# Patient Record
Sex: Female | Born: 1990
Health system: Southern US, Community
[De-identification: ages and names within clinical notes are randomized; demographics above are authoritative.]

## PROBLEM LIST (undated history)

## (undated) DIAGNOSIS — K5790 Diverticulosis of intestine, part unspecified, without perforation or abscess without bleeding: Secondary | ICD-10-CM

## (undated) DIAGNOSIS — K5792 Diverticulitis of intestine, part unspecified, without perforation or abscess without bleeding: Secondary | ICD-10-CM

## (undated) DIAGNOSIS — T7840XA Allergy, unspecified, initial encounter: Secondary | ICD-10-CM

## (undated) DIAGNOSIS — K7689 Other specified diseases of liver: Secondary | ICD-10-CM

## (undated) HISTORY — DX: Other specified diseases of liver: K76.89

## (undated) HISTORY — PX: NO PAST SURGERIES: SHX2092

## (undated) HISTORY — DX: Allergy, unspecified, initial encounter: T78.40XA

## (undated) HISTORY — DX: Diverticulitis of intestine, part unspecified, without perforation or abscess without bleeding: K57.92

## (undated) HISTORY — DX: Diverticulosis of intestine, part unspecified, without perforation or abscess without bleeding: K57.90

## (undated) HISTORY — PX: OTHER SURGICAL HISTORY: SHX169

---
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
## 2009-11-08 ENCOUNTER — Telehealth: Payer: Self-pay | Admitting: Family Medicine

## 2009-11-08 ENCOUNTER — Ambulatory Visit: Payer: Self-pay | Admitting: Family Medicine

## 2009-11-08 LAB — CONVERTED CEMR LAB
ALT: 11 units/L (ref 0–35)
AST: 17 units/L (ref 0–37)
Albumin: 4.6 g/dL (ref 3.5–5.2)
BUN: 11 mg/dL (ref 6–23)
Basophils Relative: 0.6 % (ref 0.0–3.0)
Chloride: 104 meq/L (ref 96–112)
Eosinophils Relative: 2.6 % (ref 0.0–5.0)
Glucose, Urine, Semiquant: NEGATIVE
HCT: 41.4 % (ref 36.0–46.0)
Hemoglobin: 14.3 g/dL (ref 12.0–15.0)
Lymphs Abs: 1.9 10*3/uL (ref 0.7–4.0)
MCV: 95.5 fL (ref 78.0–100.0)
Monocytes Absolute: 0.4 10*3/uL (ref 0.1–1.0)
Neutro Abs: 6.3 10*3/uL (ref 1.4–7.7)
Nitrite: NEGATIVE
Platelets: 303 10*3/uL (ref 150.0–400.0)
Potassium: 4.5 meq/L (ref 3.5–5.1)
Protein, U semiquant: NEGATIVE
Sodium: 142 meq/L (ref 135–145)
Total Bilirubin: 0.7 mg/dL (ref 0.3–1.2)
Total Protein: 7.5 g/dL (ref 6.0–8.3)
WBC Urine, dipstick: NEGATIVE
WBC: 8.8 10*3/uL (ref 4.5–10.5)
pH: 5

## 2009-11-10 ENCOUNTER — Encounter: Payer: Self-pay | Admitting: Family Medicine

## 2009-11-11 ENCOUNTER — Emergency Department (HOSPITAL_COMMUNITY): Admission: EM | Admit: 2009-11-11 | Discharge: 2009-11-11 | Payer: Self-pay | Admitting: Emergency Medicine

## 2009-11-13 ENCOUNTER — Encounter: Payer: Self-pay | Admitting: Family Medicine

## 2010-04-24 NOTE — Consult Note (Signed)
Summary: [facility]  [facility]   Imported By: Lanelle Bal 11/21/2009 09:27:01  _____________________________________________________________________  External Attachment:    Type:   Image     Comment:   External Document

## 2010-04-24 NOTE — Assessment & Plan Note (Signed)
Summary: NEW,RECTAL BLEEDING x3DAYS,[facility] INS/RH.....   Vital Signs:  Patient profile:   20 year old female Height:      67.5 inches Weight:      140 pounds BMI:     21.68 Temp:     98.2 degrees F oral Pulse rate:   66 / minute BP sitting:   110 / 76  (left arm)  Vitals Entered By: Jeremy Johann CMA (November 08, 2009 8:59 AM) CC: NEW TO ESTABLISH RECTAL BLEED WITH BM X3DAYS   History of Present Illness: Pt here to establish ---she c/o rectal bleeding ---she noticed blood on TP and in toilet.  She denies straining to go to BR or constipation.  She thinks she may have had a hemrrhoid.  She felt like she had chills and fever yesterday but she did not take her temp.   No heartburn or abd pain.    Preventive Screening-Counseling & Management  Alcohol-Tobacco     Alcohol drinks/day: <1     Smoking Status: current     Smoking Cessation Counseling: yes     Smoke Cessation Stage: precontemplative     Packs/Day: 0.5     Year Started: 2009  Caffeine-Diet-Exercise     Caffeine use/day: 2     Does Patient Exercise: yes      Drug Use:  no.    Current Medications (verified): 1)  None  Allergies (verified): No Known Drug Allergies  Past History:  Past medical, surgical, family and social histories (including risk factors) reviewed for relevance to current acute and chronic problems.  Past Medical History: NONE  Family History: Reviewed history and no changes required. HTN-DAD BREAST CA-MATERNAL GM  Social History: Reviewed history and no changes required. Single Current Smoker Alcohol use-yes Drug use-no Regular exercise-yes Smoking Status:  current Drug Use:  no Does Patient Exercise:  yes Packs/Day:  0.5 Caffeine use/day:  2  Review of Systems      See HPI  Physical Exam  General:  Well-developed,well-nourished,in no acute distress; alert,appropriate and cooperative throughout examination Abdomen:  Bowel sounds positive,abdomen soft and non-tender  without masses, organomegaly or hernias noted. Rectal:  no external abnormalities.   heme negative secretions--no stool in vaults + round smooth mass palpated ant Genitalia:  Pt on period and small tampon in place Psych:  Cognition and judgment appear intact. Alert and cooperative with normal attention span and concentration. No apparent delusions, illusions, hallucinations   Impression & Recommendations:  Problem # 1:  RECTAL MASS (ICD-787.99)  Orders: Venipuncture (95638) TLB-BMP (Basic Metabolic Panel-BMET) (80048-METABOL) TLB-CBC Platelet - w/Differential (85025-CBCD) TLB-Hepatic/Liver Function Pnl (80076-HEPATIC) Specimen Handling (75643) Gastroenterology Referral (GI)  Problem # 2:  BLOOD IN STOOL (ICD-578.1) per pt hx  Orders: Venipuncture (32951) TLB-BMP (Basic Metabolic Panel-BMET) (80048-METABOL) TLB-CBC Platelet - w/Differential (85025-CBCD) TLB-Hepatic/Liver Function Pnl (80076-HEPATIC) Specimen Handling (88416) Gastroenterology Referral (GI)  Laboratory Results   Urine Tests   Date/Time Reported: November 08, 2009 9:37 AM  Routine Urinalysis   Color: yellow Appearance: Clear Glucose: negative   (Normal Range: Negative) Bilirubin: negative   (Normal Range: Negative) Ketone: negative   (Normal Range: Negative) Spec. Gravity: 1.020   (Normal Range: 1.003-1.035) Blood: moderate   (Normal Range: Negative) pH: 5.0   (Normal Range: 5.0-8.0) Protein: negative   (Normal Range: Negative) Urobilinogen: 0.2   (Normal Range: 0-1) Nitrite: negative   (Normal Range: Negative) Leukocyte Esterace: negative   (Normal Range: Negative)    Urine HCG: negative

## 2010-04-24 NOTE — Progress Notes (Signed)
Summary: Pt upset after with OV--mother returned call  Phone Note Call from Patient   Caller: Mom Details for Reason: Questions about Appt Summary of Call: Rcv'd mssg from Mother Aileena Iglesia who wanted to discuss appt due to pt being unhappy when she arrived home. Mother would like a return call from Dr. Laury Axon C/B number [phone]. Initial call taken by: Almeta Monas CMA Duncan Dull),  November 08, 2009 3:50 PM  Follow-up for Phone Call        I will await for DPR to be scanned before I call patient mother to confirm that we protect patient privacy. Lucious Groves CMA  November 08, 2009 4:00 PM   Additional Follow-up for Phone Call Additional follow up Details #1::        DPR appoints mom, Passion Lavin to recieve information....left messge for mom to call back.Harold Barban  November 09, 2009 9:09 AM    Additional Follow-up for Phone Call Additional follow up Details #2::    Spoke with patient's mom and it is not that she is unhappy with the visit or Dr. Laury Axon, she was just upset about the rectal mass found and was scared some and was unable t tell her mom all the information she wanted to. She asked that I speak to Dr. Laury Axon to see what further information I could give her before th GI appt tomorrow. She was wanting to know if it more of a hemrroid or something truly to be concerned about like a tumor.  Follow-up by: Harold Barban,  November 09, 2009 10:41 AM  Additional Follow-up for Phone Call Additional follow up Details #3:: Details for Additional Follow-up Action Taken: called and left message on vm. yrlowne 11am  11/09/2009  MS Derrell Lolling called back--will call Dr Laury Axon after 1:15 today//sph  d/w situation with pts mom--- explained that she had a golf ball size mass in rectum that was hard---she is seeing GI tomorrow for further evaluation.  Mom will go with her.   yrlowne 11/09/2009 130p

## 2010-04-24 NOTE — Procedures (Signed)
Summary: [facility]  [facility]   Imported By: Lanelle Bal 11/28/2009 11:46:01  _____________________________________________________________________  External Attachment:    Type:   Image     Comment:   External Document

## 2010-06-08 LAB — POCT PREGNANCY, URINE: Preg Test, Ur: NEGATIVE

## 2010-06-08 LAB — URINALYSIS, ROUTINE W REFLEX MICROSCOPIC
Bilirubin Urine: NEGATIVE
Ketones, ur: NEGATIVE mg/dL
Leukocytes, UA: NEGATIVE
Nitrite: NEGATIVE
Protein, ur: NEGATIVE mg/dL
Urobilinogen, UA: 1 mg/dL (ref 0.0–1.0)

## 2010-06-08 LAB — BASIC METABOLIC PANEL
BUN: 12 mg/dL (ref 6–23)
Chloride: 105 mEq/L (ref 96–112)
Glucose, Bld: 93 mg/dL (ref 70–99)
Potassium: 3.9 mEq/L (ref 3.5–5.1)
Sodium: 139 mEq/L (ref 135–145)

## 2010-06-08 LAB — CBC
MCV: 94.3 fL (ref 78.0–100.0)
Platelets: 313 10*3/uL (ref 150–400)
RBC: 4.38 MIL/uL (ref 3.87–5.11)
RDW: 13.4 % (ref 11.5–15.5)
WBC: 10.9 10*3/uL — ABNORMAL HIGH (ref 4.0–10.5)

## 2010-06-08 LAB — DIFFERENTIAL
Basophils Absolute: 0 10*3/uL (ref 0.0–0.1)
Eosinophils Relative: 3 % (ref 0–5)
Lymphocytes Relative: 22 % (ref 12–46)
Lymphs Abs: 2.4 10*3/uL (ref 0.7–4.0)
Neutrophils Relative %: 69 % (ref 43–77)

## 2010-09-03 ENCOUNTER — Inpatient Hospital Stay (HOSPITAL_COMMUNITY)
Admission: AD | Admit: 2010-09-03 | Discharge: 2010-09-04 | Disposition: A | Discharge status: Home / Self Care | Payer: Self-pay | Source: Ambulatory Visit | Attending: Obstetrics & Gynecology | Admitting: Obstetrics & Gynecology

## 2017-03-26 DIAGNOSIS — Z01419 Encounter for gynecological examination (general) (routine) without abnormal findings: Secondary | ICD-10-CM | POA: Diagnosis not present

## 2017-03-26 DIAGNOSIS — Z124 Encounter for screening for malignant neoplasm of cervix: Secondary | ICD-10-CM | POA: Diagnosis not present

## 2017-03-26 DIAGNOSIS — Z6821 Body mass index (BMI) 21.0-21.9, adult: Secondary | ICD-10-CM | POA: Diagnosis not present

## 2017-05-06 DIAGNOSIS — L814 Other melanin hyperpigmentation: Secondary | ICD-10-CM | POA: Diagnosis not present

## 2017-05-06 DIAGNOSIS — I788 Other diseases of capillaries: Secondary | ICD-10-CM | POA: Diagnosis not present

## 2017-05-06 DIAGNOSIS — D225 Melanocytic nevi of trunk: Secondary | ICD-10-CM | POA: Diagnosis not present

## 2017-11-10 DIAGNOSIS — J029 Acute pharyngitis, unspecified: Secondary | ICD-10-CM | POA: Diagnosis not present

## 2018-08-10 DIAGNOSIS — Z03818 Encounter for observation for suspected exposure to other biological agents ruled out: Secondary | ICD-10-CM | POA: Diagnosis not present

## 2018-08-10 DIAGNOSIS — Z20828 Contact with and (suspected) exposure to other viral communicable diseases: Secondary | ICD-10-CM | POA: Diagnosis not present

## 2019-01-11 DIAGNOSIS — Z20828 Contact with and (suspected) exposure to other viral communicable diseases: Secondary | ICD-10-CM | POA: Diagnosis not present

## 2019-03-22 DIAGNOSIS — Z20828 Contact with and (suspected) exposure to other viral communicable diseases: Secondary | ICD-10-CM | POA: Diagnosis not present

## 2019-03-24 ENCOUNTER — Ambulatory Visit: Payer: BLUE CROSS/BLUE SHIELD | Attending: Internal Medicine

## 2019-03-24 DIAGNOSIS — Z20822 Contact with and (suspected) exposure to covid-19: Secondary | ICD-10-CM

## 2019-03-25 LAB — NOVEL CORONAVIRUS, NAA: SARS-CoV-2, NAA: DETECTED — AB

## 2019-06-10 ENCOUNTER — Ambulatory Visit: Payer: BLUE CROSS/BLUE SHIELD | Attending: Internal Medicine

## 2019-06-10 DIAGNOSIS — Z23 Encounter for immunization: Secondary | ICD-10-CM

## 2019-06-10 NOTE — Progress Notes (Signed)
   Covid-19 Vaccination Clinic  Name:  Michelle Leach    MRN: [id number] DOB: 04-30-1990  06/10/2019  [name] was observed post Covid-19 immunization for 15 minutes without incident. She was provided with Vaccine Information Sheet and instruction to access the V-Safe system.   [name] was instructed to call 911 with any severe reactions post vaccine: Marland Kitchen Difficulty breathing  . Swelling of face and throat  . A fast heartbeat  . A bad rash all over body  . Dizziness and weakness   Immunizations Administered    Name Date Dose VIS Date Route   Pfizer COVID-19 Vaccine 06/10/2019  8:12 AM 0.3 mL 03/05/2019 Intramuscular   Manufacturer: [employer], [employer]   Lot: [id number]   NDC: [address]-8

## 2019-07-05 ENCOUNTER — Ambulatory Visit: Payer: BLUE CROSS/BLUE SHIELD | Attending: Internal Medicine

## 2019-07-05 DIAGNOSIS — Z23 Encounter for immunization: Secondary | ICD-10-CM

## 2019-07-05 NOTE — Progress Notes (Signed)
   Covid-19 Vaccination Clinic  Name:  Michelle Leach    MRN: [id number] DOB: 05/19/1990  07/05/2019  [name] was observed post Covid-19 immunization for 15 minutes without incident. She was provided with Vaccine Information Sheet and instruction to access the V-Safe system.   [name] was instructed to call 911 with any severe reactions post vaccine: Marland Kitchen Difficulty breathing  . Swelling of face and throat  . A fast heartbeat  . A bad rash all over body  . Dizziness and weakness   Immunizations Administered    Name Date Dose VIS Date Route   Pfizer COVID-19 Vaccine 07/05/2019  8:21 AM 0.3 mL 03/05/2019 Intramuscular   Manufacturer: [employer], [employer]   Lot: [id number]   NDC: [address]-3

## 2020-04-07 DIAGNOSIS — Z1152 Encounter for screening for COVID-19: Secondary | ICD-10-CM | POA: Diagnosis not present

## 2020-10-29 DIAGNOSIS — K7689 Other specified diseases of liver: Secondary | ICD-10-CM | POA: Diagnosis not present

## 2020-10-29 DIAGNOSIS — K769 Liver disease, unspecified: Secondary | ICD-10-CM | POA: Diagnosis not present

## 2020-10-29 DIAGNOSIS — K5730 Diverticulosis of large intestine without perforation or abscess without bleeding: Secondary | ICD-10-CM | POA: Diagnosis not present

## 2020-10-29 DIAGNOSIS — K5792 Diverticulitis of intestine, part unspecified, without perforation or abscess without bleeding: Secondary | ICD-10-CM | POA: Diagnosis not present

## 2020-10-29 DIAGNOSIS — R1032 Left lower quadrant pain: Secondary | ICD-10-CM | POA: Diagnosis not present

## 2020-10-29 DIAGNOSIS — K5732 Diverticulitis of large intestine without perforation or abscess without bleeding: Secondary | ICD-10-CM | POA: Diagnosis not present

## 2020-11-16 ENCOUNTER — Encounter (HOSPITAL_COMMUNITY): Payer: Self-pay | Admitting: Emergency Medicine

## 2020-11-16 ENCOUNTER — Other Ambulatory Visit: Payer: Self-pay

## 2020-11-16 ENCOUNTER — Emergency Department (HOSPITAL_COMMUNITY): Payer: BLUE CROSS/BLUE SHIELD

## 2020-11-16 ENCOUNTER — Emergency Department (HOSPITAL_COMMUNITY)
Admission: EM | Admit: 2020-11-16 | Discharge: 2020-11-16 | Disposition: A | Discharge status: Home / Self Care | Payer: BLUE CROSS/BLUE SHIELD | Attending: Emergency Medicine | Admitting: Emergency Medicine

## 2020-11-16 DIAGNOSIS — R600 Localized edema: Secondary | ICD-10-CM | POA: Diagnosis not present

## 2020-11-16 DIAGNOSIS — R609 Edema, unspecified: Secondary | ICD-10-CM | POA: Diagnosis not present

## 2020-11-16 DIAGNOSIS — W228XXA Striking against or struck by other objects, initial encounter: Secondary | ICD-10-CM | POA: Diagnosis not present

## 2020-11-16 DIAGNOSIS — S90111A Contusion of right great toe without damage to nail, initial encounter: Secondary | ICD-10-CM | POA: Diagnosis not present

## 2020-11-16 DIAGNOSIS — M79671 Pain in right foot: Secondary | ICD-10-CM | POA: Diagnosis not present

## 2020-11-16 DIAGNOSIS — S99921A Unspecified injury of right foot, initial encounter: Secondary | ICD-10-CM | POA: Diagnosis not present

## 2020-11-16 NOTE — ED Notes (Signed)
An After Visit Summary was printed and given to the patient. Discharge instructions given and no further questions at this time.  

## 2020-11-16 NOTE — ED Triage Notes (Signed)
Patient hurt her great toe on her R foot last Friday, kicked a desk, ambulating with discomfort and pain. Tried RICE at home.

## 2020-11-16 NOTE — ED Provider Notes (Addendum)
[facility]-EMERGENCY DEPT Provider Note   CSN: [id number] Arrival date & time: 11/16/20  1543     History No chief complaint on file.   Michelle Leach is a 30 y.o. female.  30 year old female with prior medical history as documented below presents for evaluation. She reports pain to right great toe after kicking a desk. She is concerned that toe is fractured. No other complaint.  The history is provided by the patient.  Toe Pain This is a new problem. The current episode started more than 2 days ago. The problem has not changed since onset.Pertinent negatives include no chest pain and no abdominal pain. The symptoms are aggravated by walking.      History reviewed. No pertinent past medical history.  There are no problems to display for this patient.   History reviewed. No pertinent surgical history.   OB History   No obstetric history on file.     No family history on file.  Social History   Tobacco Use   Smoking status: Never   Smokeless tobacco: Never  Substance Use Topics   Alcohol use: Not Currently   Drug use: Not Currently    Home Medications Prior to Admission medications   Not on File    Allergies    Patient has no known allergies.  Review of Systems   Review of Systems  Cardiovascular:  Negative for chest pain.  Gastrointestinal:  Negative for abdominal pain.  All other systems reviewed and are negative.  Physical Exam Updated Vital Signs BP 127/82 (BP Location: Right Arm)   Pulse 67   Temp 98.4 F (36.9 C) (Oral)   Resp 18   Ht 5' 7.5" (1.715 m)   Wt 59 kg   SpO2 99%   BMI 20.06 kg/m   Physical Exam Vitals and nursing note reviewed.  Constitutional:      General: She is not in acute distress.    Appearance: Normal appearance. She is well-developed.  HENT:     Head: Normocephalic and atraumatic.  Eyes:     Conjunctiva/sclera: Conjunctivae normal.     Pupils: Pupils are equal, round, and reactive to  light.  Cardiovascular:     Rate and Rhythm: Normal rate and regular rhythm.     Heart sounds: Normal heart sounds.  Pulmonary:     Effort: Pulmonary effort is normal. No respiratory distress.     Breath sounds: Normal breath sounds.  Abdominal:     General: There is no distension.     Palpations: Abdomen is soft.     Tenderness: There is no abdominal tenderness.  Musculoskeletal:        General: Tenderness present. No deformity. Normal range of motion.     Cervical back: Normal range of motion and neck supple.     Comments: TTP along the dorsal aspect of the right great toe  Skin:    General: Skin is warm and dry.  Neurological:     General: No focal deficit present.     Mental Status: She is alert and oriented to person, place, and time.    ED Results / Procedures / Treatments   Labs (all labs ordered are listed, but only abnormal results are displayed) Labs Reviewed - No data to display  EKG None  Radiology DG Foot Complete Right  Result Date: 11/16/2020 CLINICAL DATA:  Right foot pain.  Injured great toe. EXAM: RIGHT FOOT COMPLETE - 3+ VIEW COMPARISON:  None. FINDINGS: There is  no evidence of fracture or dislocation. Well corticated density adjacent to the medial aspect of the first metatarsal phalangeal joint likely represents an accessory ossicle. There is no evidence of arthropathy or other focal bone abnormality. Mild medial soft tissue edema. IMPRESSION: No fracture or subluxation of the foot. Mild medial soft tissue edema. Electronically Signed   By: Narda Rutherford M.D.   On: 11/16/2020 16:32    Procedures Procedures   Medications Ordered in ED Medications - No data to display  ED Course  I have reviewed the triage vital signs and the nursing notes.  Pertinent labs & imaging results that were available during my care of the patient were reviewed by me and considered in my medical decision making (see chart for details).    MDM Rules/Calculators/A&P                            MDM  MSE complete  Zahrah A Terwilliger was evaluated in Emergency Department on 11/16/2020 for the symptoms described in the history of present illness. She was evaluated in the context of the global COVID-19 pandemic, which necessitated consideration that the patient might be at risk for infection with the SARS-CoV-2 virus that causes COVID-19. Institutional protocols and algorithms that pertain to the evaluation of patients at risk for COVID-19 are in a state of rapid change based on information released by regulatory bodies including the CDC and federal and state organizations. These policies and algorithms were followed during the patient's care in the ED.  Patient complains of right foot great toe pain after kicking a desk.  She is concerned about possible fracture.  X-ray was negative for fracture or other acute pathology.  Patient feels improved after ED evaluation  She understands need for close follow-up.  Strict return precautions given and understood.  Final Clinical Impression(s) / ED Diagnoses Final diagnoses:  Contusion of right great toe without damage to nail, initial encounter    Rx / DC Orders ED Discharge Orders     None        Wynetta Fines, MD 11/16/20 1653    Wynetta Fines, MD 12/01/20 1433

## 2020-11-16 NOTE — Discharge Instructions (Addendum)
Return for any problem.  ?

## 2020-11-29 ENCOUNTER — Encounter: Payer: Self-pay | Admitting: Nurse Practitioner

## 2020-11-29 ENCOUNTER — Other Ambulatory Visit: Payer: Self-pay

## 2020-11-29 ENCOUNTER — Ambulatory Visit: Payer: BLUE CROSS/BLUE SHIELD | Admitting: Nurse Practitioner

## 2020-11-29 VITALS — BP 108/80 | HR 60 | Temp 98.1°F | Ht 64.0 in | Wt 129.8 lb

## 2020-11-29 DIAGNOSIS — K769 Liver disease, unspecified: Secondary | ICD-10-CM

## 2020-11-29 DIAGNOSIS — Z7689 Persons encountering health services in other specified circumstances: Secondary | ICD-10-CM | POA: Diagnosis not present

## 2020-11-29 NOTE — Patient Instructions (Signed)
Diverticulitis Diverticulitis is infection or inflammation of small pouches (diverticula) in the colon that form due to a condition called diverticulosis. Diverticula can trap stool (feces) and bacteria, causing infection and inflammation. Diverticulitis may cause severe stomach pain and diarrhea. It may lead to tissue damage in the colon that causes bleeding or blockage. The diverticula may also burst (rupture) and cause infected stool to enter other areas of the abdomen. What are the causes? This condition is caused by stool becoming trapped in the diverticula, which allows bacteria to grow in the diverticula. This leads to inflammation and infection. What increases the risk? You are more likely to develop this condition if you have diverticulosis. The risk increases if you: Are overweight or obese. Do not get enough exercise. Drink alcohol. Use tobacco products. Eat a diet that has a lot of red meat such as beef, pork, or lamb. Eat a diet that does not include enough fiber. High-fiber foods include fruits, vegetables, beans, nuts, and whole grains. Are over 40 years of age. What are the signs or symptoms? Symptoms of this condition may include: Pain and tenderness in the abdomen. The pain is normally located on the left side of the abdomen, but it may occur in other areas. Fever and chills. Nausea. Vomiting. Cramping. Bloating. Changes in bowel routines. Blood in your stool. How is this diagnosed? This condition is diagnosed based on: Your medical history. A physical exam. Tests to make sure there is nothing else causing your condition. These tests may include: Blood tests. Urine tests. CT scan of the abdomen. How is this treated? Most cases of this condition are mild and can be treated at home. Treatment may include: Taking over-the-counter pain medicines. Following a clear liquid diet. Taking antibiotic medicines by mouth. Resting. More severe cases may need to be treated  at a hospital. Treatment may include: Not eating or drinking. Taking prescription pain medicine. Receiving antibiotic medicines through an IV. Receiving fluids and nutrition through an IV. Surgery. When your condition is under control, your health care provider may recommend that you have a colonoscopy. This is an exam to look at the entire large intestine. During the exam, a lubricated, bendable tube is inserted into the anus and then passed into the rectum, colon, and other parts of the large intestine. A colonoscopy can show how severe your diverticula are and whether something else may be causing your symptoms. Follow these instructions at home: Medicines Take over-the-counter and prescription medicines only as told by your health care provider. These include fiber supplements, probiotics, and stool softeners. If you were prescribed an antibiotic medicine, take it as told by your health care provider. Do not stop taking the antibiotic even if you start to feel better. Ask your health care provider if the medicine prescribed to you requires you to avoid driving or using machinery. Eating and drinking  Follow a full liquid diet or another diet as directed by your health care provider. After your symptoms improve, your health care provider may tell you to change your diet. He or she may recommend that you eat a diet that contains at least 25 grams (25 g) of fiber daily. Fiber makes it easier to pass stool. Healthy sources of fiber include: Berries. One cup contains 4-8 grams of fiber. Beans or lentils. One-half cup contains 5-8 grams of fiber. Green vegetables. One cup contains 4 grams of fiber. Avoid eating red meat. General instructions Do not use any products that contain nicotine or tobacco, such as cigarettes,   e-cigarettes, and chewing tobacco. If you need help quitting, ask your health care provider. Exercise for at least 30 minutes, 3 times each week. You should exercise hard enough to  raise your heart rate and break a sweat. Keep all follow-up visits as told by your health care provider. This is important. You may need to have a colonoscopy. Contact a health care provider if: Your pain does not improve. Your bowel movements do not return to normal. Get help right away if: Your pain gets worse. Your symptoms do not get better with treatment. Your symptoms suddenly get worse. You have a fever. You vomit more than one time. You have stools that are bloody, black, or tarry. Summary Diverticulitis is infection or inflammation of small pouches (diverticula) in the colon that form due to a condition called diverticulosis. Diverticula can trap stool (feces) and bacteria, causing infection and inflammation. You are at higher risk for this condition if you have diverticulosis and you eat a diet that does not include enough fiber. Most cases of this condition are mild and can be treated at home. More severe cases may need to be treated at a hospital. When your condition is under control, your health care provider may recommend that you have an exam called a colonoscopy. This exam can show how severe your diverticula are and whether something else may be causing your symptoms. Keep all follow-up visits as told by your health care provider. This is important. This information is not intended to replace advice given to you by your health care provider. Make sure you discuss any questions you have with your health care provider. Document Revised: 12/21/2018 Document Reviewed: 12/21/2018 Elsevier Patient Education  2022 [employer]  

## 2020-11-29 NOTE — Progress Notes (Signed)
[facility]	[place] as a Neurosurgeon for [employer], NP.,have documented all relevant documentation on the behalf of [employer], NP,as directed by  Charlesetta Ivory, NP while in the presence of Charlesetta Ivory, NP.  This visit occurred during the SARS-CoV-2 public health emergency.  Safety protocols were in place, including screening questions prior to the visit, additional usage of staff PPE, and extensive cleaning of exam room while observing appropriate contact time as indicated for disinfecting solutions.  Subjective:     Patient ID: Michelle Leach , female    DOB: February 26, 1991 , 30 y.o.   MRN: [id number]   Chief Complaint  Patient presents with   Establish Care     HPI  Pt is here today to establish care. Pt had an CT scan done 4 weeks ago, diverticulitis she was diagnosed with she was given an antibiotic & after the 9 day period she complained of no issues. They also spotted a lesion on her liver, stating she would need an MRI, she recently moved back home, here. From [place].  We would have to get the result of her CT to get her the MRI.  Unsure of the size of the lesion found. Request put in for pt permission to retrieve her results from the CT from [facility] in [place], [place]. No other concerns.     Past Medical History:  Diagnosis Date   Allergy      No family history on file.  No current outpatient medications on file.   No Known Allergies   Review of Systems  Constitutional: Negative.  Negative for chills and fever.  HENT:  Negative for congestion.   Respiratory: Negative.  Negative for shortness of breath and wheezing.   Cardiovascular: Negative.  Negative for chest pain and palpitations.  Gastrointestinal:  Negative for abdominal distention, constipation and diarrhea.  Neurological: Negative.   Psychiatric/Behavioral: Negative.      Today's Vitals   11/29/20 1601  BP: 108/80  Pulse: 60  Temp: 98.1 F (36.7 C)  Weight: 129 lb 12.8 oz  (58.9 kg)  Height: 5\' 4"  (1.626 m)   Body mass index is 22.28 kg/m.  Wt Readings from Last 3 Encounters:  11/29/20 129 lb 12.8 oz (58.9 kg)  11/16/20 130 lb (59 kg)    Objective:  Physical Exam Constitutional:      Appearance: Normal appearance.  HENT:     Head: Normocephalic and atraumatic.  Cardiovascular:     Rate and Rhythm: Normal rate and regular rhythm.     Pulses: Normal pulses.     Heart sounds: Normal heart sounds. No murmur heard. Pulmonary:     Effort: Pulmonary effort is normal. No respiratory distress.     Breath sounds: Normal breath sounds. No wheezing.  Skin:    Capillary Refill: Capillary refill takes less than 2 seconds.  Neurological:     Mental Status: She is alert and oriented to person, place, and time.        Assessment And Plan:     1. Encounter to establish care --Patient is here to establish care. 11/18/20 over patient medical, family, social and surgical history. -Reviewed with patient their medications and any allergies  -Reviewed with patient their sexual orientation, drug/tobacco and alcohol use -Dicussed any new concerns with patient  -recommended patient comes in for a physical exam and complete blood work.  -Educated patient about the importance of annual screenings and immunizations.  -Advised patient to eat a healthy diet along with exercise for atleast  30-45 min atleast 4-5 days of the week.  2. Lesion of liver -Recent of CT of abdomen showed lesion at liver as per patient.  -Requested CT from [facility] in [place], [place] to view the CT for further assessment and MRI. Waiting results.  -will send in a referral to GI for further work up  - Ambulatory referral to Gastroenterology    The patient was encouraged to call or send a message through MyChart for any questions or concerns.   Follow up: 3-4 months for annual physical exam   Side effects and appropriate use of all the medication(s) were discussed with the patient today.  Patient advised to use the medication(s) as directed by their healthcare provider. The patient was encouraged to read, review, and understand all associated package inserts and contact our office with any questions or concerns. The patient accepts the risks of the treatment plan and had an opportunity to ask questions.   Staying healthy and adopting a healthy lifestyle for your overall health is important. You should eat 7 or more servings of fruits and vegetables per day. You should drink plenty of water to keep yourself hydrated and your kidneys healthy. This includes about 65-80+ fluid ounces of water. Limit your intake of animal fats especially for elevated cholesterol. Avoid highly processed food and limit your salt intake if you have hypertension. Avoid foods high in saturated/Trans fats. Along with a healthy diet it is also very important to maintain time for yourself to maintain a healthy mental health with low stress levels. You should get atleast 150 min of moderate intensity exercise weekly for a healthy heart. Along with eating right and exercising, aim for at least 7-9 hours of sleep daily.  Eat more whole grains which includes barley, wheat berries, oats, brown rice and whole wheat pasta. Use healthy plant oils which include olive, soy, corn, sunflower and peanut. Limit your caffeine and sugary drinks. Limit your intake of fast foods. Limit milk and dairy products to one or two daily servings.    IF YOU HAVE BEEN REFERRED TO A SPECIALIST, IT MAY TAKE 1-2 WEEKS TO SCHEDULE/PROCESS THE REFERRAL. IF YOU HAVE NOT HEARD FROM US/SPECIALIST IN TWO WEEKS, PLEASE GIVE [place] A CALL AT [phone].   THE PATIENT IS ENCOURAGED TO PRACTICE SOCIAL DISTANCING DUE TO THE COVID-19 PANDEMIC.

## 2020-12-05 ENCOUNTER — Other Ambulatory Visit: Payer: Self-pay | Admitting: Nurse Practitioner

## 2020-12-05 DIAGNOSIS — K769 Liver disease, unspecified: Secondary | ICD-10-CM

## 2020-12-29 ENCOUNTER — Ambulatory Visit
Admission: RE | Admit: 2020-12-29 | Discharge: 2020-12-29 | Disposition: A | Discharge status: Home / Self Care | Payer: BLUE CROSS/BLUE SHIELD | Source: Ambulatory Visit | Attending: Nurse Practitioner | Admitting: Nurse Practitioner

## 2020-12-29 ENCOUNTER — Other Ambulatory Visit: Payer: Self-pay

## 2020-12-29 DIAGNOSIS — K7689 Other specified diseases of liver: Secondary | ICD-10-CM | POA: Diagnosis not present

## 2020-12-29 DIAGNOSIS — K769 Liver disease, unspecified: Secondary | ICD-10-CM

## 2020-12-29 MED ORDER — GADOBENATE DIMEGLUMINE 529 MG/ML IV SOLN
12.0000 mL | Freq: Once | INTRAVENOUS | Status: AC | PRN
  Administered 2020-12-29: 12 mL via INTRAVENOUS

## 2021-01-09 ENCOUNTER — Other Ambulatory Visit: Payer: Self-pay

## 2021-01-09 ENCOUNTER — Ambulatory Visit (INDEPENDENT_AMBULATORY_CARE_PROVIDER_SITE_OTHER): Payer: BLUE CROSS/BLUE SHIELD | Admitting: Nurse Practitioner

## 2021-01-09 ENCOUNTER — Encounter: Payer: Self-pay | Admitting: Nurse Practitioner

## 2021-01-09 VITALS — BP 110/64 | HR 74 | Temp 98.0°F | Ht 64.0 in | Wt 129.4 lb

## 2021-01-09 DIAGNOSIS — Z0000 Encounter for general adult medical examination without abnormal findings: Secondary | ICD-10-CM

## 2021-01-09 DIAGNOSIS — Z1159 Encounter for screening for other viral diseases: Secondary | ICD-10-CM | POA: Diagnosis not present

## 2021-01-09 DIAGNOSIS — K769 Liver disease, unspecified: Secondary | ICD-10-CM

## 2021-01-09 DIAGNOSIS — Z114 Encounter for screening for human immunodeficiency virus [HIV]: Secondary | ICD-10-CM

## 2021-01-09 DIAGNOSIS — Z23 Encounter for immunization: Secondary | ICD-10-CM | POA: Diagnosis not present

## 2021-01-09 NOTE — Progress Notes (Signed)
I,Tianna Badgett,acting as a Education administrator for [employer], NP.,have documented all relevant documentation on the behalf of [employer], NP,as directed by  Bary Castilla, NP while in the presence of Bary Castilla, NP.  This visit occurred during the SARS-CoV-2 public health emergency.  Safety protocols were in place, including screening questions prior to the visit, additional usage of staff PPE, and extensive cleaning of exam room while observing appropriate contact time as indicated for disinfecting solutions.  Subjective:     Patient ID: Michelle Leach , female    DOB: 1990-12-17 , 30 y.o.   MRN: [id number]   Chief Complaint  Patient presents with   Annual Exam    HPI  Patient here for HM.  She is followed by [facility] for her GYN care. She is due for Tdap that she will get today.  She will make an appt with her OBGYN for a pap smear. She has no concerns.  Diet: healthy  Exercise: She is walking.  She does go to the dentist.     Past Medical History:  Diagnosis Date   Allergy      No family history on file.  No current outpatient medications on file.   No Known Allergies    The patient states she uses none for birth control. Last LMP was Patient's last menstrual period was 12/25/2020 (exact date)..  . Negative for: breast discharge, breast lump(s), breast pain and breast self exam. Associated symptoms include abnormal vaginal bleeding. Pertinent negatives include abnormal bleeding (hematology), anxiety, decreased libido, depression, difficulty falling sleep, dyspareunia, history of infertility, nocturia, sexual dysfunction, sleep disturbances, urinary incontinence, urinary urgency, vaginal discharge and vaginal itching. Diet regular.The patient states her exercise level is    . The patient's tobacco use is:  Social History   Tobacco Use  Smoking Status Never  Smokeless Tobacco Never  . She has been exposed to passive smoke. The patient's alcohol  use is:  Social History   Substance and Sexual Activity  Alcohol Use Yes   Alcohol/week: 4.0 - 6.0 standard drinks   Types: 4 - 6 Standard drinks or equivalent per week   Comment: socially  . Additional information: Last pap unsure, next one scheduled for 2022.    Review of Systems  Constitutional: Negative.  Negative for chills, fatigue and fever.  HENT: Negative.  Negative for congestion, sinus pain and sneezing.   Eyes: Negative.   Respiratory: Negative.  Negative for cough, shortness of breath and wheezing.   Cardiovascular: Negative.  Negative for chest pain and palpitations.  Gastrointestinal:  Negative for abdominal distention, diarrhea, nausea and vomiting.  Endocrine: Negative.  Negative for polydipsia, polyphagia and polyuria.  Genitourinary: Negative.  Negative for hematuria.  Musculoskeletal: Negative.  Negative for arthralgias, myalgias and neck pain.  Skin: Negative.   Allergic/Immunologic: Negative.   Neurological: Negative.  Negative for dizziness, weakness, numbness and headaches.  Hematological: Negative.   Psychiatric/Behavioral: Negative.  Negative for sleep disturbance and suicidal ideas.     Today's Vitals   01/09/21 1413  BP: 110/64  Pulse: 74  Temp: 98 F (36.7 C)  TempSrc: Oral  Weight: 129 lb 6.4 oz (58.7 kg)  Height: '5\' 4"'  (1.626 m)   Body mass index is 22.21 kg/m.   Objective:  Physical Exam Vitals and nursing note reviewed.  Constitutional:      Appearance: Normal appearance.  HENT:     Head: Normocephalic and atraumatic.     Right Ear: Tympanic membrane, ear canal and  external ear normal.     Left Ear: Tympanic membrane, ear canal and external ear normal.     Nose: Nose normal. No congestion or rhinorrhea.     Mouth/Throat:     Mouth: Mucous membranes are moist.     Pharynx: Oropharynx is clear.  Eyes:     Extraocular Movements: Extraocular movements intact.     Conjunctiva/sclera: Conjunctivae normal.     Pupils: Pupils are equal,  round, and reactive to light.  Cardiovascular:     Rate and Rhythm: Normal rate and regular rhythm.     Pulses: Normal pulses.     Heart sounds: Normal heart sounds. No murmur heard. Pulmonary:     Effort: Pulmonary effort is normal. No respiratory distress.     Breath sounds: Normal breath sounds. No wheezing.  Abdominal:     General: Abdomen is flat. Bowel sounds are normal.     Palpations: Abdomen is soft.  Genitourinary:    Comments: deferred Musculoskeletal:        General: No swelling. Normal range of motion.     Cervical back: Normal range of motion and neck supple.  Skin:    General: Skin is warm and dry.     Capillary Refill: Capillary refill takes less than 2 seconds.  Neurological:     General: No focal deficit present.     Mental Status: She is alert and oriented to person, place, and time.  Psychiatric:        Mood and Affect: Mood normal.        Behavior: Behavior normal.        Assessment And Plan:     1. Encounter for annual physical exam --Patient is here for their annual physical exam and we discussed any changes to medication and medical history.  -Behavior modification was discussed as well as diet and exercise history  -Patient will continue to exercise regularly and modify their diet.  -Recommendation for yearly physical annuals, immunization and screenings including mammogram and colonoscopy were discussed with the patient.  -Recommended intake of multivitamin, vitamin D and calcium.  -Individualized advise was given to the patient pertaining to their own health history in regards to diet, exercise, medical condition and referrals.  - CBC - Hemoglobin A1c - CMP14+EGFR - Lipid panel  2. Lesion of liver greater than 1 cm in diameter -MRI showed scattered tiny hepatic cysts measuring 5 mm in left lobe in the liver. No suspicious hepatic lesion  -Advised to follow up with GI. Referral already put in for her.   3. Encounter for hepatitis C screening  test for low risk patient - Hepatitis C antibody  4. Encounter for screening for HIV - HIV Antibody (routine testing w rflx)  5. Need for Tdap vaccination - Tdap vaccine greater than or equal to 7yo IM  The patient was encouraged to call or send a message through [place] for any questions or concerns.   Follow up: if symptoms persist or do not get better.   Side effects and appropriate use of all the medication(s) were discussed with the patient today. Patient advised to use the medication(s) as directed by their healthcare provider. The patient was encouraged to read, review, and understand all associated package inserts and contact our office with any questions or concerns. The patient accepts the risks of the treatment plan and had an opportunity to ask questions.   Staying healthy and adopting a healthy lifestyle for your overall health is important. You should eat 7  or more servings of fruits and vegetables per day. You should drink plenty of water to keep yourself hydrated and your kidneys healthy. This includes about 65-80+ fluid ounces of water. Limit your intake of animal fats especially for elevated cholesterol. Avoid highly processed food and limit your salt intake if you have hypertension. Avoid foods high in saturated/Trans fats. Along with a healthy diet it is also very important to maintain time for yourself to maintain a healthy mental health with low stress levels. You should get atleast 150 min of moderate intensity exercise weekly for a healthy heart. Along with eating right and exercising, aim for at least 7-9 hours of sleep daily.  Eat more whole grains which includes barley, wheat berries, oats, brown rice and whole wheat pasta. Use healthy plant oils which include olive, soy, corn, sunflower and peanut. Limit your caffeine and sugary drinks. Limit your intake of fast foods. Limit milk and dairy products to one or two daily servings.   Patient was given opportunity to ask  questions. Patient verbalized understanding of the plan and was able to repeat key elements of the plan. All questions were answered to their satisfaction.  Raman Dade Rodin, DNP   I, Raman Jamita Mckelvin have reviewed all documentation for this visit. The documentation on 01/09/21 for the exam, diagnosis, procedures, and orders are all accurate and complete.    THE PATIENT IS ENCOURAGED TO PRACTICE SOCIAL DISTANCING DUE TO THE COVID-19 PANDEMIC.

## 2021-01-09 NOTE — Patient Instructions (Signed)
Health Maintenance, Female Adopting a healthy lifestyle and getting preventive care are important in promoting health and wellness. Ask your health care provider about: The right schedule for you to have regular tests and exams. Things you can do on your own to prevent diseases and keep yourself healthy. What should I know about diet, weight, and exercise? Eat a healthy diet  Eat a diet that includes plenty of vegetables, fruits, low-fat dairy products, and lean protein. Do not eat a lot of foods that are high in solid fats, added sugars, or sodium. Maintain a healthy weight Body mass index (BMI) is used to identify weight problems. It estimates body fat based on height and weight. Your health care provider can help determine your BMI and help you achieve or maintain a healthy weight. Get regular exercise Get regular exercise. This is one of the most important things you can do for your health. Most adults should: Exercise for at least 150 minutes each week. The exercise should increase your heart rate and make you sweat (moderate-intensity exercise). Do strengthening exercises at least twice a week. This is in addition to the moderate-intensity exercise. Spend less time sitting. Even light physical activity can be beneficial. Watch cholesterol and blood lipids Have your blood tested for lipids and cholesterol at 32 years of age, then have this test every 5 years. Have your cholesterol levels checked more often if: Your lipid or cholesterol levels are high. You are older than 32 years of age. You are at high risk for heart disease. What should I know about cancer screening? Depending on your health history and family history, you may need to have cancer screening at various ages. This may include screening for: Breast cancer. Cervical cancer. Colorectal cancer. Skin cancer. Lung cancer. What should I know about heart disease, diabetes, and high blood pressure? Blood pressure and heart  disease High blood pressure causes heart disease and increases the risk of stroke. This is more likely to develop in people who have high blood pressure readings, are of African descent, or are overweight. Have your blood pressure checked: Every 3-5 years if you are 18-39 years of age. Every year if you are 40 years old or older. Diabetes Have regular diabetes screenings. This checks your fasting blood sugar level. Have the screening done: Once every three years after age 40 if you are at a normal weight and have a low risk for diabetes. More often and at a younger age if you are overweight or have a high risk for diabetes. What should I know about preventing infection? Hepatitis B If you have a higher risk for hepatitis B, you should be screened for this virus. Talk with your health care provider to find out if you are at risk for hepatitis B infection. Hepatitis C Testing is recommended for: Everyone born from 1945 through 1965. Anyone with known risk factors for hepatitis C. Sexually transmitted infections (STIs) Get screened for STIs, including gonorrhea and chlamydia, if: You are sexually active and are younger than 32 years of age. You are older than 32 years of age and your health care provider tells you that you are at risk for this type of infection. Your sexual activity has changed since you were last screened, and you are at increased risk for chlamydia or gonorrhea. Ask your health care provider if you are at risk. Ask your health care provider about whether you are at high risk for HIV. Your health care provider may recommend a prescription medicine   to help prevent HIV infection. If you choose to take medicine to prevent HIV, you should first get tested for HIV. You should then be tested every 3 months for as long as you are taking the medicine. Pregnancy If you are about to stop having your period (premenopausal) and you may become pregnant, seek counseling before you get  pregnant. Take 400 to 800 micrograms (mcg) of folic acid every day if you become pregnant. Ask for birth control (contraception) if you want to prevent pregnancy. Osteoporosis and menopause Osteoporosis is a disease in which the bones lose minerals and strength with aging. This can result in bone fractures. If you are 65 years old or older, or if you are at risk for osteoporosis and fractures, ask your health care provider if you should: Be screened for bone loss. Take a calcium or vitamin D supplement to lower your risk of fractures. Be given hormone replacement therapy (HRT) to treat symptoms of menopause. Follow these instructions at home: Lifestyle Do not use any products that contain nicotine or tobacco, such as cigarettes, e-cigarettes, and chewing tobacco. If you need help quitting, ask your health care provider. Do not use street drugs. Do not share needles. Ask your health care provider for help if you need support or information about quitting drugs. Alcohol use Do not drink alcohol if: Your health care provider tells you not to drink. You are pregnant, may be pregnant, or are planning to become pregnant. If you drink alcohol: Limit how much you use to 0-1 drink a day. Limit intake if you are breastfeeding. Be aware of how much alcohol is in your drink. In the [place] one drink equals one 12 oz bottle of beer (355 mL), one 5 oz glass of wine (148 mL), or one 1 oz glass of hard liquor (44 mL). General instructions Schedule regular health, dental, and eye exams. Stay current with your vaccines. Tell your health care provider if: You often feel depressed. You have ever been abused or do not feel safe at home. Summary Adopting a healthy lifestyle and getting preventive care are important in promoting health and wellness. Follow your health care provider's instructions about healthy diet, exercising, and getting tested or screened for diseases. Follow your health care provider's  instructions on monitoring your cholesterol and blood pressure. This information is not intended to replace advice given to you by your health care provider. Make sure you discuss any questions you have with your health care provider. Document Revised: 05/19/2020 Document Reviewed: 03/04/2018 Elsevier Patient Education  2022 Elsevier Inc.  

## 2021-01-10 LAB — LIPID PANEL
Chol/HDL Ratio: 2.8 ratio (ref 0.0–4.4)
Cholesterol, Total: 171 mg/dL (ref 100–199)
HDL: 62 mg/dL (ref >39)
LDL Chol Calc (NIH): 92 mg/dL (ref 0–99)
Triglycerides: 92 mg/dL (ref 0–149)
VLDL Cholesterol Cal: 17 mg/dL (ref 5–40)

## 2021-01-10 LAB — CMP14+EGFR
ALT: 20 IU/L (ref 0–32)
AST: 27 IU/L (ref 0–40)
Albumin/Globulin Ratio: 2.1 (ref 1.2–2.2)
Albumin: 5.2 g/dL — ABNORMAL HIGH (ref 3.8–4.8)
Alkaline Phosphatase: 65 IU/L (ref 44–121)
BUN/Creatinine Ratio: 15 (ref 9–23)
BUN: 11 mg/dL (ref 6–20)
Bilirubin Total: 0.5 mg/dL (ref 0.0–1.2)
CO2: 24 mmol/L (ref 20–29)
Calcium: 9.7 mg/dL (ref 8.7–10.2)
Chloride: 98 mmol/L (ref 96–106)
Creatinine, Ser: 0.72 mg/dL (ref 0.57–1.00)
Globulin, Total: 2.5 g/dL (ref 1.5–4.5)
Glucose: 83 mg/dL (ref 70–99)
Potassium: 4.5 mmol/L (ref 3.5–5.2)
Sodium: 139 mmol/L (ref 134–144)
Total Protein: 7.7 g/dL (ref 6.0–8.5)
eGFR: 114 mL/min/{1.73_m2} (ref >59)

## 2021-01-10 LAB — CBC
Hematocrit: 41.1 % (ref 34.0–46.6)
Hemoglobin: 14 g/dL (ref 11.1–15.9)
MCH: 31.6 pg (ref 26.6–33.0)
MCHC: 34.1 g/dL (ref 31.5–35.7)
MCV: 93 fL (ref 79–97)
Platelets: 366 10*3/uL (ref 150–450)
RBC: 4.43 x10E6/uL (ref 3.77–5.28)
RDW: 12.3 % (ref 11.7–15.4)
WBC: 7.3 10*3/uL (ref 3.4–10.8)

## 2021-01-10 LAB — HEMOGLOBIN A1C
Est. average glucose Bld gHb Est-mCnc: 97 mg/dL
Hgb A1c MFr Bld: 5 % (ref 4.8–5.6)

## 2021-01-10 LAB — HEPATITIS C ANTIBODY: Hep C Virus Ab: <0.1 s/co ratio (ref 0.0–0.9)

## 2021-01-10 LAB — HIV ANTIBODY (ROUTINE TESTING W REFLEX): HIV Screen 4th Generation wRfx: NONREACTIVE

## 2021-02-06 ENCOUNTER — Encounter: Payer: Self-pay | Admitting: Gastroenterology

## 2021-02-06 ENCOUNTER — Ambulatory Visit: Payer: BLUE CROSS/BLUE SHIELD | Admitting: Gastroenterology

## 2021-02-06 DIAGNOSIS — R932 Abnormal findings on diagnostic imaging of liver and biliary tract: Secondary | ICD-10-CM

## 2021-02-06 DIAGNOSIS — K5732 Diverticulitis of large intestine without perforation or abscess without bleeding: Secondary | ICD-10-CM

## 2021-02-06 NOTE — Patient Instructions (Addendum)
It was a pleasure to meet you today.  Your liver cysts are benign and not expected to become problems in the future. No formal follow-up is needed.  Given your recurrent diverticulitis, I have recommended: - A colonoscopy to confirm the diagnosis and exclude other causes - Follow a high fiber diet or use fiber supplements on a regular basis. - There is no need to avoid seeds, nuts, corn, and berries. - Non-steroidal antiinflammatory medications (such as ibuprofen, naproxen, etc) may increase your risk for a recurrent of diverticulitis. Please avoid these medications whenever possible.    If you are age 32 or younger, your body mass index should be between 19-25. Your Body mass index is 20.89 kg/m. If this is out of the aformentioned range listed, please consider follow up with your Primary Care Provider.   ________________________________________________________  The  GI providers would like to encourage you to use [facility] to communicate with providers for non-urgent requests or questions.  Due to long hold times on the telephone, sending your provider a message by [facility] may be a faster and more efficient way to get a response.  Please allow 48 business hours for a response.  Please remember that this is for non-urgent requests.  _______________________________________________________  Bonita Quin have been scheduled for a colonoscopy. Please follow written instructions given to you at your visit today.  Please pick up your prep supplies at the pharmacy within the next 32-3 days. If you use inhalers (even only as needed), please bring them with you on the day of your procedure.  Due to recent changes in healthcare laws, you may see the results of your imaging and laboratory studies on MyChart before your provider has had a chance to review them.  We understand that in some cases there may be results that are confusing or concerning to you. Not all laboratory results come back in the same time  frame and the provider may be waiting for multiple results in order to interpret others.  Please give [place] 48 hours in order for your provider to thoroughly review all the results before contacting the office for clarification of your results.   Thank you for entrusting me with your care and choosing [facility].  Dr Orvan Falconer

## 2021-02-06 NOTE — Progress Notes (Addendum)
Referring Provider: Charlesetta Ivory, NP Primary Care Physician:  Charlesetta Ivory, NP   Reason for Consultation:  Abnormal liver MRI   IMPRESSION:  Benign cysts seen on recent MRI: Reassurance provided. No formal follow-up indicated.  Recent, recurrent acute diverticulitis: First episode 2011. CT documented diverticulitis 10/2020. No ongoing symptoms related to diverticulitis. Obtain prior CT scan to clarify. If present, colonoscopy recommended. Reviewed recommendations to follow a high fiber diet or to use fiber supplements on a regular basis.  However, there is no need to avoid seeds, corn, berries, and nuts. Minimizing red meat in the diet may protect against future episodes of diverticulitis. Using NSAIDs may be associated with a moderately increased risk of occurrence of any episode of diverticulitis and complicated diverticulitis.   PLAN: - Colonoscopy to confirm diagnosis and exclude other causes - Avoid NSAIDs - High fiber diet with daily psyllium or methylcellulose - See patient instructions for additional recommendations.    HPI: Michelle Leach is a 30 y.o. female referred by NP Ghumman for an abnormal liver on MRI. The history is obtained through the patient and review of her electronic health record.   History of acute diverticulitis in 2011. CT scan from that time not available in Epic. Flexible sigmoidoscopy with Dr. Ewing Schlein 11/13/09 showed sigmoid diverticulosis  Acute diverticulitis while in [place] in August. CT scan showed a 9cm area of involved diverticulosis in the descending colon with uncomplicated diverticulitis, surrounding edema and a 1.7 cm IVA liver lesion. Follow-up MRI with and without contrast in [facility] 01/01/21 showed scattered tiny hepatic cysts measuring up to 20mm in the left lobe. There were no suspicious hepatic lesions. Acute symptoms resolved with 10 days of antibiotics.  Not using NSAIDs. Currently uses a fiber supplements and daily probiotics.    No GI symptoms at this time.   Labs 01/09/21 showed normal liver enzymes, albumin 5.2, normal CBC  Father with acute diverticulitis requiring emergency surgery. No other family history of GI disease, colon cancer, or colon polyps.    Past Medical History:  Diagnosis Date   Allergy    Diverticulitis    Diverticulosis    Liver cyst     Past Surgical History:  Procedure Laterality Date   NO PAST SURGERIES     Current Outpatient Medications  Medication Sig Dispense Refill   FIBER PO Take 1 capsule by mouth in the morning, at noon, and at bedtime.     Multiple Vitamin (MULTIVITAMIN) tablet Take 2 tablets by mouth daily. Gummies     Probiotic Product (PROBIOTIC PO) Take 1 capsule by mouth daily.     No current facility-administered medications for this visit.    Allergies as of 02/06/2021   (No Known Allergies)    Family History  Problem Relation Age of Onset   Diverticulitis Father    Breast cancer Maternal Grandmother      Review of Systems: 12 system ROS is negative except as noted above.   Physical Exam: General:   Alert,  well-nourished, pleasant and cooperative in NAD Head:  Normocephalic and atraumatic. Eyes:  Sclera clear, no icterus.   Conjunctiva pink. Ears:  Normal auditory acuity. Nose:  No deformity, discharge,  or lesions. Mouth:  No deformity or lesions.   Neck:  Supple; no masses or thyromegaly. Lungs:  Clear throughout to auscultation.   No wheezes. Heart:  Regular rate and rhythm; no murmurs. Abdomen:  Soft,nontender, nondistended, normal bowel sounds, no rebound or guarding. No hepatosplenomegaly.   Rectal:  Deferred  Msk:  Symmetrical. No boney deformities LAD: No inguinal or umbilical LAD Extremities:  No clubbing or edema. Neurologic:  Alert and  oriented x4;  grossly nonfocal Skin:  Intact without significant lesions or rashes. Psych:  Alert and cooperative. Normal mood and affect.     Dillan Lunden L. Orvan Falconer, MD, MPH 02/06/2021, 9:39  AM

## 2021-03-21 ENCOUNTER — Encounter: Payer: Self-pay | Admitting: Gastroenterology

## 2021-03-29 ENCOUNTER — Encounter: Payer: Self-pay | Admitting: Gastroenterology

## 2021-03-29 ENCOUNTER — Other Ambulatory Visit: Payer: Self-pay

## 2021-03-29 ENCOUNTER — Ambulatory Visit (AMBULATORY_SURGERY_CENTER): Payer: BLUE CROSS/BLUE SHIELD | Admitting: Gastroenterology

## 2021-03-29 VITALS — BP 100/65 | HR 65 | Temp 98.4°F | Resp 22 | Ht 67.0 in | Wt 133.0 lb

## 2021-03-29 DIAGNOSIS — K5732 Diverticulitis of large intestine without perforation or abscess without bleeding: Secondary | ICD-10-CM

## 2021-03-29 MED ORDER — SODIUM CHLORIDE 0.9 % IV SOLN: 500.0000 mL | Freq: Once | INTRAVENOUS | Status: DC

## 2021-03-29 NOTE — Progress Notes (Signed)
VS by DT  Pt's states no medical or surgical changes since previsit or office visit.  

## 2021-03-29 NOTE — Progress Notes (Signed)
° °  Referring Provider: Bary Castilla, NP Primary Care Physician:  Bary Castilla, NP  Reason for Procedure:  Diverticulitis   IMPRESSION:  Diverticulitis Appropriate candidate for monitored anesthesia care  PLAN: Colonoscopy in the [facility] today   HPI: Michelle Leach is a 31 y.o. female presents for colonoscopy to follow-up after CT-documented diverticulitis last year. Please see my office note from 02/06/21 for complete details.   No prior colonoscopy or colon cancer screening.  No known family history of colon cancer or polyps. Father with acute diverticulitis. No family history of uterine/endometrial cancer, pancreatic cancer or gastric/stomach cancer.   Past Medical History:  Diagnosis Date   Allergy    Diverticulitis    Diverticulosis    Liver cyst     Past Surgical History:  Procedure Laterality Date   NO PAST SURGERIES      Current Outpatient Medications  Medication Sig Dispense Refill   FIBER PO Take 1 capsule by mouth in the morning, at noon, and at bedtime.     Multiple Vitamin (MULTIVITAMIN) tablet Take 2 tablets by mouth daily. Gummies     Probiotic Product (PROBIOTIC PO) Take 1 capsule by mouth daily.     Current Facility-Administered Medications  Medication Dose Route Frequency Provider Last Rate Last Admin   0.9 %  sodium chloride infusion  500 mL Intravenous Once Thornton Park, MD        Allergies as of 03/29/2021   (No Known Allergies)    Family History  Problem Relation Age of Onset   Diverticulitis Father    Breast cancer Maternal Grandmother    Colon cancer Neg Hx    Esophageal cancer Neg Hx    Rectal cancer Neg Hx    Stomach cancer Neg Hx      Physical Exam: General:   Alert,  well-nourished, pleasant and cooperative in NAD Head:  Normocephalic and atraumatic. Eyes:  Sclera clear, no icterus.   Conjunctiva pink. Mouth:  No deformity or lesions.   Neck:  Supple; no masses or thyromegaly. Lungs:  Clear throughout to  auscultation.   No wheezes. Heart:  Regular rate and rhythm; no murmurs. Abdomen:  Soft, non-tender, nondistended, normal bowel sounds, no rebound or guarding.  Msk:  Symmetrical. No boney deformities LAD: No inguinal or umbilical LAD Extremities:  No clubbing or edema. Neurologic:  Alert and  oriented x4;  grossly nonfocal Skin:  No obvious rash or bruise. Psych:  Alert and cooperative. Normal mood and affect.     Studies/Results: No results found.    [name], MD, MPH 03/29/2021, 10:17 AM

## 2021-03-29 NOTE — Progress Notes (Signed)
PT taken to PACU. Monitors in place. VSS. Report given to RN. 

## 2021-03-29 NOTE — Patient Instructions (Signed)
HANDOUTS PROVIDED ON: HIGH FIBER DIET & DIVERTICULOSIS  Your next colonoscopy should occur at age 31.    You may resume your previous diet and medication schedule.  Thank you for allowing [place] to care for you today!!!   YOU HAD AN ENDOSCOPIC PROCEDURE TODAY AT THE [facility]:   Refer to the procedure report that was given to you for any specific questions about what was found during the examination.  If the procedure report does not answer your questions, please call your gastroenterologist to clarify.  If you requested that your care partner not be given the details of your procedure findings, then the procedure report has been included in a sealed envelope for you to review at your convenience later.  YOU SHOULD EXPECT: Some feelings of bloating in the abdomen. Passage of more gas than usual.  Walking can help get rid of the air that was put into your GI tract during the procedure and reduce the bloating. If you had a lower endoscopy (such as a colonoscopy or flexible sigmoidoscopy) you may notice spotting of blood in your stool or on the toilet paper. If you underwent a bowel prep for your procedure, you may not have a normal bowel movement for a few days.  Please Note:  You might notice some irritation and congestion in your nose or some drainage.  This is from the oxygen used during your procedure.  There is no need for concern and it should clear up in a day or so.  SYMPTOMS TO REPORT IMMEDIATELY:  Following lower endoscopy (colonoscopy or flexible sigmoidoscopy):  Excessive amounts of blood in the stool  Significant tenderness or worsening of abdominal pains  Swelling of the abdomen that is new, acute  Fever of 100F or higher  For urgent or emergent issues, a gastroenterologist can be reached at any hour by calling [phone]. Do not use MyChart messaging for urgent concerns.    DIET:  We do recommend a small meal at first, but then you may proceed to your regular  diet.  Drink plenty of fluids but you should avoid alcoholic beverages for 24 hours.  ACTIVITY:  You should plan to take it easy for the rest of today and you should NOT DRIVE or use heavy machinery until tomorrow (because of the sedation medicines used during the test).    FOLLOW UP: Our staff will call the number listed on your records Monday between 7:15 am and 8:15 am following your procedure to check on you and address any questions or concerns that you may have regarding the information given to you following your procedure. If we do not reach you, we will leave a message.  We will attempt to reach you two times.  During this call, we will ask if you have developed any symptoms of COVID 19. If you develop any symptoms (ie: fever, flu-like symptoms, shortness of breath, cough etc.) before then, please call [phone].  If you test positive for Covid 19 in the 2 weeks post procedure, please call and report this information to [place].    If any biopsies were taken you will be contacted by phone or by letter within the next 1-3 weeks.  Please call us at [phone] if you have not heard about the biopsies in 3 weeks.    SIGNATURES/CONFIDENTIALITY: You and/or your care partner have signed paperwork which will be entered into your electronic medical record.  These signatures attest to the fact that that the information above  on your After Visit Summary has been reviewed and is understood.  Full responsibility of the confidentiality of this discharge information lies with you and/or your care-partner.

## 2021-03-29 NOTE — Op Note (Signed)
[facility] Patient Name: Michelle Leach Procedure Date: 03/29/2021 10:17 AM MRN: [id number] Endoscopist: Tressia DanasKimberly Kellina Dreese MD, MD Age: [age] Referring MD:  Date of Birth: 1990/08/03 Gender: Female Account #: [id number] Procedure:                Colonoscopy Indications:              Follow-up of diverticulitis                           CT documented diverticulitis 10/2020 Medicines:                Monitored Anesthesia Care Procedure:                Pre-Anesthesia Assessment:                           - Prior to the procedure, a History and Physical                            was performed, and patient medications and                            allergies were reviewed. The patient's tolerance of                            previous anesthesia was also reviewed. The risks                            and benefits of the procedure and the sedation                            options and risks were discussed with the patient.                            All questions were answered, and informed consent                            was obtained. Prior Anticoagulants: The patient has                            taken no previous anticoagulant or antiplatelet                            agents. ASA Grade Assessment: II - A patient with                            mild systemic disease. After reviewing the risks                            and benefits, the patient was deemed in                            satisfactory condition to undergo the procedure.  After obtaining informed consent, the colonoscope                            was passed under direct vision. Throughout the                            procedure, the patient's blood pressure, pulse, and                            oxygen saturations were monitored continuously. The                            Olympus CF-HQ190L [id number]) Colonoscope was                            introduced through the anus and advanced to the  3                            cm into the ileum. A second forward view of the                            right colon was performed. The colonoscopy was                            performed without difficulty. The patient tolerated                            the procedure well. The quality of the bowel                            preparation was good. The terminal ileum, ileocecal                            valve, appendiceal orifice, and rectum were                            photographed. Scope In: 10:29:38 AM Scope Out: 10:42:27 AM Scope Withdrawal Time: 0 hours 9 minutes 49 seconds  Total Procedure Duration: 0 hours 12 minutes 49 seconds  Findings:                 The perianal and digital rectal examinations were                            normal.                           Multiple small and large-mouthed diverticula were                            found in the sigmoid colon and distal descending                            colon.  The exam was otherwise without abnormality on                            direct and retroflexion views. Complications:            No immediate complications. Estimated Blood Loss:     Estimated blood loss: none. Impression:               - Diverticulosis in the sigmoid colon and in the                            distal descending colon.                           - The examination was otherwise normal on direct                            and retroflexion views. Recommendation:           - Patient has a contact number available for                            emergencies. The signs and symptoms of potential                            delayed complications were discussed with the                            patient. Return to normal activities tomorrow.                            Written discharge instructions were provided to the                            patient.                           - Continue present medications.                            - I recommend that you have another colonoscopy at                            age 59 to start colon cancer screening.                           - Follow a high fiber diet. Drink at least 64                            ounces of water daily. Add a daily stool bulking                            agent such as psyllium (an exampled would be                            Metamucil).                           -  Emerging evidence supports eating a diet of                            fruits, vegetables, grains, calcium, and yogurt                            while reducing red meat and alcohol may reduce the                            risk of colon cancer.                           - Thank you for allowing me to be involved in your                            colon cancer prevention. Tressia DanasKimberly Brunette Lavalle MD, MD 03/29/2021 10:48:14 AM This report has been signed electronically.

## 2021-04-02 ENCOUNTER — Telehealth: Payer: Self-pay | Admitting: *Deleted

## 2021-04-02 NOTE — Telephone Encounter (Signed)
°  Follow up Call-  Call back number 03/29/2021  Post procedure Call Back phone  # [phone]  Permission to leave phone message Yes  Some recent data might be hidden     Patient questions:  Do you have a fever, pain , or abdominal swelling? No. Pain Score  0 *  Have you tolerated food without any problems? Yes.    Have you been able to return to your normal activities? Yes.    Do you have any questions about your discharge instructions: Diet   No. Medications  No. Follow up visit  No.  Do you have questions or concerns about your Care? No.  Actions: * If pain score is 4 or above: No action needed, pain <4.

## 2021-07-19 ENCOUNTER — Ambulatory Visit: Payer: BC Managed Care – PPO | Admitting: Allergy

## 2021-07-19 ENCOUNTER — Encounter: Payer: Self-pay | Admitting: Allergy

## 2021-07-19 VITALS — BP 112/68 | HR 64 | Temp 97.9°F | Resp 16 | Ht 67.0 in | Wt 136.2 lb

## 2021-07-19 DIAGNOSIS — T781XXA Other adverse food reactions, not elsewhere classified, initial encounter: Secondary | ICD-10-CM | POA: Diagnosis not present

## 2021-07-19 DIAGNOSIS — L246 Irritant contact dermatitis due to food in contact with skin: Secondary | ICD-10-CM

## 2021-07-19 DIAGNOSIS — H1013 Acute atopic conjunctivitis, bilateral: Secondary | ICD-10-CM | POA: Diagnosis not present

## 2021-07-19 DIAGNOSIS — J3089 Other allergic rhinitis: Secondary | ICD-10-CM | POA: Diagnosis not present

## 2021-07-19 DIAGNOSIS — T781XXD Other adverse food reactions, not elsewhere classified, subsequent encounter: Secondary | ICD-10-CM

## 2021-07-19 MED ORDER — OLOPATADINE HCL 0.2 % OP SOLN: 1.0000 [drp] | Freq: Every day | OPHTHALMIC | 5 refills | Status: DC | PRN

## 2021-07-19 MED ORDER — RYALTRIS 665-25 MCG/ACT NA SUSP: NASAL | 2 refills | Status: DC

## 2021-07-19 MED ORDER — LEVOCETIRIZINE DIHYDROCHLORIDE 5 MG PO TABS: 5.0000 mg | ORAL_TABLET | Freq: Every evening | ORAL | 2 refills | Status: DC

## 2021-07-19 NOTE — Patient Instructions (Signed)
-   Testing today showed: grasses, weeds, trees, indoor molds, outdoor molds, dust mites, cat, dog, and horse . ?- Copy of test results provided.  ?- Avoidance measures provided. ?- Stop taking: Claritin ?- Start taking: Xyzal (levocetirizine) 5mg  tablet once daily. This replaces Claritin as usually more effective.  ?Ryaltris (olopatadine/mometasone) two sprays per nostril 1-2 times daily as needed for nasal congestion or drainage ?Pataday (olopatadine) one drop per eye daily as needed for itchy/watery eyes ?- You can use an extra dose of the antihistamine, if needed, for breakthrough symptoms.  ?- Consider nasal saline rinses 1-2 times daily to remove allergens from the nasal cavities as well as help with mucous clearance (this is especially helpful to do before the nasal sprays are given) ?- Consider allergy shots as a means of long-term control. ?- Allergy shots "re-train" and "reset" the immune system to ignore environmental allergens and decrease the resulting immune response to those allergens (sneezing, itchy watery eyes, runny nose, nasal congestion, etc).    ?- Allergy shots improve symptoms in 80-85% of patients.  ?- We can discuss more at a future appointment if the medications are not working for you. ? ?- Food allergy testing is positive to almond and hops ?- Continue avoidance of beer/food-containing hops and also recommend avoidance of almond ?- Have access to self-injectable epinephrine (Epipen or AuviQ) 0.3mg  at all times ?- Follow emergency action plan in case of allergic reaction ? ?- Appears you have an irritant contact dermatitis to the acidic juices of apples and tomatoes.   ?- Food allergy testing for apple and tomato are negative ?- If you decide to consume these foods would recommend like being off your face and hands immediately after eating and try your best not to get any juices or liquid on the skin ? ?Follow-up in 4-6 months or sooner if needed ?

## 2021-07-19 NOTE — Progress Notes (Signed)
? ? ?New Patient Note ? ?RE: Michelle Leach MRN: [id number] DOB: 03-12-1991 ?Date of Office Visit: 07/19/2021 ? ?Primary care provider: Bary Castilla, NP ? ?Chief Complaint: environmental and food allergy ? ?History of present illness: ?Michelle Leach is a 31 y.o. female presenting today for evaluation of allergic rhinitis and food allergy.   ? ?She reports seasonal allergies since childhood.  She states during spring time she usually doesn't go outside. She has itchy/watery eyes, nasal congestion and drainage, sneezing as her primary symptoms that are worse in spring and fall.   She has been taking claritin as needed and helps somewhat.  She has used nose sprays like afrin and flonase in the past.  She has used eyedrops as needed that were OTC options.   ? ?With apples and tomatoes she notes may develop hives on skin contact of getting juice on skin.   ?She states there is some tree nut that causes her mouth to feel dry but she is not sure exactly which one.   ?When she would have beer she would have facial redness and stomach upset.  She does not drink beers or drink alcohol anymore.   ? ?No history of asthma or eczema.  ? ?Review of systems: ?Review of Systems  ?Constitutional: Negative.   ?HENT:    ?     See HPI  ?Eyes: Negative.   ?Respiratory: Negative.    ?Cardiovascular: Negative.   ?Gastrointestinal: Negative.   ?Musculoskeletal: Negative.   ?Skin: Negative.   ?Allergic/Immunologic: Negative.   ?Neurological: Negative.   ? ?All other systems negative unless noted above in HPI ? ?Past medical history: ?Past Medical History:  ?Diagnosis Date  ? Allergy   ? Diverticulitis   ? Diverticulosis   ? Liver cyst   ? ? ?Past surgical history: ?Past Surgical History:  ?Procedure Laterality Date  ? NO PAST SURGERIES    ? ? ?Family history:  ?Family History  ?Problem Relation Age of Onset  ? Diverticulitis Father   ? Breast cancer Maternal Grandmother   ? Colon cancer Neg Hx   ? Esophageal cancer Neg Hx   ? Rectal  cancer Neg Hx   ? Stomach cancer Neg Hx   ? ? ?Social history: ?Lives in a townhome without carpeting with electric heating and central cooling.  No pets in the home.  No concern for water damage, mildew or roaches in the home.  She is a Designer, industrial/product.  Denies smoking history  ? ?Medication List: ?Current Outpatient Medications  ?Medication Sig Dispense Refill  ? FIBER PO Take 1 capsule by mouth in the morning, at noon, and at bedtime.    ? Multiple Vitamin (MULTIVITAMIN) tablet Take 2 tablets by mouth daily. Gummies    ? Probiotic Product (PROBIOTIC PO) Take 1 capsule by mouth daily.    ? ?No current facility-administered medications for this visit.  ? ? ?Known medication allergies: ?No Known Allergies ? ? ?Physical examination: ?Blood pressure 112/68, pulse 64, temperature 97.9 ?F (36.6 ?C), resp. rate 16, height 5\' 7"  (1.702 m), weight 136 lb 3.2 oz (61.8 kg), SpO2 100 %. ? ?General: Alert, interactive, in no acute distress. ?HEENT: PERRLA, TMs pearly gray, turbinates mildly edematous without discharge, post-pharynx non erythematous. ?Neck: Supple without lymphadenopathy. ?Lungs: Clear to auscultation without wheezing, rhonchi or rales. {no increased work of breathing. ?CV: Normal S1, S2 without murmurs. ?Abdomen: Nondistended, nontender. ?Skin: Warm and dry, without lesions or rashes. ?Extremities:  No clubbing, cyanosis or edema. ?Neuro:  Grossly intact. ? ?Diagnositics/Labs: ? ?Allergy testing:  ? Airborne Adult Perc - 07/19/21 1507   ? ? Time Antigen Placed [id number]   ? Allergen Manufacturer Lavella Hammock   ? Location Back   ? Number of Test 59   ? Panel 1 Select   ? 2. Control-Histamine 1 mg/ml 2+   ? 4. Ward Negative   ? 5. [place] 2+   ? 6. Johnson Negative   ? 7. Joseph Blue 3+   ? 8. Meadow Fescue 4+   ? 9. Perennial Rye 4+   ? 10. Sweet Vernal 4+   ? 11. Timothy 4+   ? 12. Cocklebur Negative   ? 13. Burweed Marshelder Negative   ? 14. Ragweed, short Negative   ? 15. Ragweed, Giant Negative   ? 16.  Plantain,  English 2+   ? 17. Lamb's Quarters Negative   ? 18. Sheep Sorrell 2+   ? 19. Rough Pigweed 3+   ? 20. Marsh Elder, Rough Negative   ? 21. Mugwort, Common Negative   ? 22. Ash mix 3+   ? 23. Birch mix 4+   ? 24. Beech American 4+   ? 25. Box, Elder 3+   ? 26. Cedar, red Negative   ? 27. Cottonwood, [place] Negative   ? 28. Elm mix 2+   ? 29. Hickory 2+   ? 30. Maple mix 2+   ? 31. Oak, [place] mix Negative   ? 32. Pecan Pollen 4+   ? 33. Pine mix Negative   ? 34. Sycamore Eastern Negative   ? 35. [place], Black Pollen 3+   ? 36. Alternaria alternata Negative   ? 1. Cladosporium Herbarum Negative   ? 38. Aspergillus mix Negative   ? 39. Penicillium mix Negative   ? 40. Bipolaris sorokiniana (Helminthosporium) Negative   ? 41. Drechslera spicifera (Curvularia) Negative   ? 42. Mucor plumbeus Negative   ? 43. Fusarium moniliforme Negative   ? 44. Aureobasidium pullulans (pullulara) Negative   ? 45. Rhizopus oryzae Negative   ? 46. Botrytis cinera Negative   ? 47. Epicoccum nigrum Negative   ? 48. Phoma betae Negative   ? 49. Candida Albicans Negative   ? 50. Trichophyton mentagrophytes Negative   ? 51. Mite, D Farinae  5,000 AU/ml 3+   ? 52. Mite, D Pteronyssinus  5,000 AU/ml 3+   ? 53. Cat Hair 10,000 BAU/ml 2+   ? 54.  Dog Epithelia 2+   ? 55. Mixed Feathers Negative   ? 56. Horse Epithelia 2+   ? 57. Cockroach, [place] Negative   ? 58. Mouse Negative   ? 59. Tobacco Leaf Negative   ? ?  ?  ? ?  ? ? Intradermal - 07/19/21 1545   ? ? Time Antigen Placed [id number]   ? Allergen Manufacturer Lavella Hammock   ? Location Arm   ? Number of Test 6   ? Intradermal Select   ? Control Negative   ? Mold 1 2+   ? Mold 2 2+   ? Mold 3 2+   ? Mold 4 2+   ? Cockroach Negative   ? ?  ?  ? ?  ? ? Food Adult Perc - 07/19/21 1500   ? ? Time Antigen Placed 1508   ? Allergen Manufacturer Lavella Hammock   ? Location Back   ? Number of allergen test 14   ? Control-Histamine 1 mg/ml 2+   ? 10. Cashew Negative   ?  11. Pecan Food Negative   ? 12. [place] Negative   ? 13. Almond 2+   ? 14. Hazelnut Negative   ? 15. [place] nut Negative   ? 16. Coconut Negative   ? 17. Pistachio Negative   ? 30. Barley Negative   ? 32. Rye  Negative   ? 33. Hops 2+   ? 42. Tomato Negative   ? 58. Apple Negative   ? ?  ?  ? ?  ?  ?Allergy testing results were read and interpreted by provider, documented by clinical staff. ? ? ?Assessment and plan: ?Allergic rhinitis with conjunctivitis  ?- Testing today showed: grasses, weeds, trees, indoor molds, outdoor molds, dust mites, cat, dog, and horse . ?- Copy of test results provided.  ?- Avoidance measures provided. ?- Stop taking: Claritin ?- Start taking: Xyzal (levocetirizine) 5mg  tablet once daily. This replaces Claritin as usually more effective.  ?Ryaltris (olopatadine/mometasone) two sprays per nostril 1-2 times daily as needed for nasal congestion or drainage ?Pataday (olopatadine) one drop per eye daily as needed for itchy/watery eyes ?- You can use an extra dose of the antihistamine, if needed, for breakthrough symptoms.  ?- Consider nasal saline rinses 1-2 times daily to remove allergens from the nasal cavities as well as help with mucous clearance (this is especially helpful to do before the nasal sprays are given) ?- Consider allergy shots as a means of long-term control. ?- Allergy shots "re-train" and "reset" the immune system to ignore environmental allergens and decrease the resulting immune response to those allergens (sneezing, itchy watery eyes, runny nose, nasal congestion, etc).    ?- Allergy shots improve symptoms in 80-85% of patients.  ?- We can discuss more at a future appointment if the medications are not working for you. ? ?Adverse food reaction ?- Food allergy testing is positive to almond and hops ?- Continue avoidance of beer/food-containing hops and also recommend avoidance of almond ?- Have access to self-injectable epinephrine (Epipen or AuviQ) 0.3mg  at all times ?- Follow emergency action plan in case  of allergic reaction ? ?Irritant contact dermatitis ?- Appears you have an irritant contact dermatitis to the acidic juices of apples and tomatoes.   ?- Food allergy testing for apple and tomato are negat

## 2021-07-26 ENCOUNTER — Other Ambulatory Visit (HOSPITAL_COMMUNITY)
Admission: RE | Admit: 2021-07-26 | Discharge: 2021-07-26 | Disposition: A | Discharge status: Home / Self Care | Payer: BC Managed Care – PPO | Source: Ambulatory Visit | Attending: Radiology | Admitting: Radiology

## 2021-07-26 ENCOUNTER — Encounter: Payer: Self-pay | Admitting: Radiology

## 2021-07-26 ENCOUNTER — Ambulatory Visit (INDEPENDENT_AMBULATORY_CARE_PROVIDER_SITE_OTHER): Payer: BC Managed Care – PPO | Admitting: Radiology

## 2021-07-26 VITALS — BP 116/74 | Ht 66.25 in | Wt 136.0 lb

## 2021-07-26 DIAGNOSIS — Z01419 Encounter for gynecological examination (general) (routine) without abnormal findings: Secondary | ICD-10-CM | POA: Insufficient documentation

## 2021-07-26 NOTE — Progress Notes (Signed)
? ?  Michelle Leach Nov 24, 1990 [id number] ? ? ?History:  31 y.o. G0 presents for annual exam. No gyn concerns. ? ?Gynecologic History ?Patient's last menstrual period was 07/07/2021. ?Period Cycle (Days): 28 ?Period Duration (Days): 4 ?Period Pattern: Regular ?Menstrual Flow: Moderate ?Dysmenorrhea: (!) Moderate ?Dysmenorrhea Symptoms: Cramping ?Contraception/Family planning:  female partner ?Sexually active: yes ?Last Pap: 2019. Results were: normal ? ? ?Obstetric History ?OB History  ?Gravida Para Term Preterm AB Living  ?0 0 0 0 0 0  ?SAB IAB Ectopic Multiple Live Births  ?0 0 0 0 0  ? ? ? ?The following portions of the patient's history were reviewed and updated as appropriate: allergies, current medications, past family history, past medical history, past social history, past surgical history, and problem list. ? ?Review of Systems ?Pertinent items noted in HPI and remainder of comprehensive ROS otherwise negative.  ? ?Past medical history, past surgical history, family history and social history were all reviewed and documented in the EPIC chart. ? ? ?Exam: ? ?Vitals:  ? 07/26/21 1538  ?BP: 116/74  ?Weight: 136 lb (61.7 kg)  ?Height: 5' 6.25" (1.683 m)  ? ?Body mass index is 21.79 kg/m?. ? ?General appearance:  Normal ?Thyroid:  Symmetrical, normal in size, without palpable masses or nodularity. ?Respiratory ? Auscultation:  Clear without wheezing or rhonchi ?Cardiovascular ? Auscultation:  Regular rate, without rubs, murmurs or gallops ? Edema/varicosities:  Not grossly evident ?Abdominal ? Soft,nontender, without masses, guarding or rebound. ? Liver/spleen:  No organomegaly noted ? Hernia:  None appreciated ? Skin ? Inspection:  Grossly normal ?Breasts: Examined lying and sitting.  ? Right: Without masses, retractions, nipple discharge or axillary adenopathy. ? ? Left: Without masses, retractions, nipple discharge or axillary adenopathy. ?Genitourinary  ? Inguinal/mons:  Normal without inguinal  adenopathy ? External genitalia:  Normal appearing vulva with no masses, tenderness, or lesions ? BUS/Urethra/Skene's glands:  Normal without masses or exudate ? Vagina:  Normal appearing with normal color and discharge, no lesions ? Cervix:  Normal appearing without discharge or lesions ? Uterus:  Normal in size, shape and contour.  Mobile, nontender ? Adnexa/parametria:   ?  Rt: Normal in size, without masses or tenderness. ?  Lt: Normal in size, without masses or tenderness. ? Anus and perineum: Normal ?  ?Patient informed chaperone available to be present for breast and pelvic exam. Patient has requested no chaperone to be present. Patient has been advised what will be completed during breast and pelvic exam.  ? ?Assessment/Plan:   ?1. Well woman exam with routine gynecological exam ?Pap with cotesting ?Mammo at 38 ?- Cytology - PAP( [facility]) ?  ? ? ?Discussed SBE, colonoscopy and DEXA screening as directed/appropriate. Recommend of exercise weekly, including weight bearing exercise. Encouraged the use of seatbelts and sunscreen. ?Return in 1 year for annual or as needed.  ? ?Arlie Solomons B WHNP-BC 3:55 PM 07/26/2021  ?

## 2021-07-27 LAB — CYTOLOGY - PAP
Comment: NEGATIVE
Diagnosis: NEGATIVE
High risk HPV: NEGATIVE

## 2022-01-14 ENCOUNTER — Encounter: Payer: BLUE CROSS/BLUE SHIELD | Admitting: Nurse Practitioner

## 2022-01-17 ENCOUNTER — Ambulatory Visit: Payer: BC Managed Care – PPO | Admitting: Allergy

## 2022-02-28 ENCOUNTER — Ambulatory Visit: Payer: BC Managed Care – PPO | Admitting: Allergy

## 2022-04-11 ENCOUNTER — Ambulatory Visit: Payer: BC Managed Care – PPO | Admitting: Allergy

## 2022-05-08 ENCOUNTER — Ambulatory Visit: Payer: BC Managed Care – PPO | Admitting: Allergy

## 2022-05-08 ENCOUNTER — Encounter: Payer: Self-pay | Admitting: Allergy

## 2022-05-08 ENCOUNTER — Other Ambulatory Visit: Payer: Self-pay

## 2022-05-08 VITALS — BP 98/68 | HR 66 | Temp 98.6°F | Resp 20 | Ht 66.25 in | Wt 129.6 lb

## 2022-05-08 DIAGNOSIS — T781XXD Other adverse food reactions, not elsewhere classified, subsequent encounter: Secondary | ICD-10-CM

## 2022-05-08 DIAGNOSIS — H1013 Acute atopic conjunctivitis, bilateral: Secondary | ICD-10-CM | POA: Diagnosis not present

## 2022-05-08 DIAGNOSIS — J3089 Other allergic rhinitis: Secondary | ICD-10-CM | POA: Diagnosis not present

## 2022-05-08 DIAGNOSIS — L246 Irritant contact dermatitis due to food in contact with skin: Secondary | ICD-10-CM | POA: Diagnosis not present

## 2022-05-08 MED ORDER — OLOPATADINE HCL 0.2 % OP SOLN: 1.0000 [drp] | Freq: Every day | OPHTHALMIC | 5 refills | Status: DC | PRN

## 2022-05-08 MED ORDER — LEVOCETIRIZINE DIHYDROCHLORIDE 5 MG PO TABS: 5.0000 mg | ORAL_TABLET | Freq: Every evening | ORAL | 5 refills | Status: DC

## 2022-05-08 MED ORDER — RYALTRIS 665-25 MCG/ACT NA SUSP: NASAL | 5 refills | Status: DC

## 2022-05-08 MED ORDER — EPINEPHRINE 0.3 MG/0.3ML IJ SOAJ: 0.3000 mg | INTRAMUSCULAR | 1 refills | Status: DC | PRN

## 2022-05-08 NOTE — Progress Notes (Unsigned)
Follow-up Note  RE: Michelle Leach MRN: [id number] DOB: [birth year] Date of Office Visit: 05/08/2022   History of present illness: Michelle Leach is a 32 y.o. female presenting today for follow-up of allergic rhinitis with conjunctivitis, food allergy and irritant contact dermatitis. She was last seen in the office on 07/19/21 by myself.  She states she has been doing well since last visit without any major health changes, surgeries or hospitalizations.   She states the pollen season went ok for her and she does feel the medications in place are effective.  She takes Xyzal now and feels it works better than the Claritin at this time.  The ryaltris was a helpful nasal spray when she had it to use.  She has not needed to use the pataday.   She has avoided almond and hops in the diet and feels like she is doing well with these foods out of the diet.  She has not had any of the stomach pains or facial redness like she would before with consuming alcohol.  She also avoids contact of apples and tomatoes on the skin to prevent hives.    Review of systems: Review of Systems  Constitutional: Negative.   HENT: Negative.    Eyes: Negative.   Respiratory: Negative.    Cardiovascular: Negative.   Gastrointestinal: Negative.   Musculoskeletal: Negative.   Skin: Negative.   Allergic/Immunologic: Negative.   Neurological: Negative.      All other systems negative unless noted above in HPI  Past medical/social/surgical/family history have been reviewed and are unchanged unless specifically indicated below.  No changes  Medication List: Current Outpatient Medications  Medication Sig Dispense Refill   EPINEPHrine (EPIPEN 2-PAK) 0.3 mg/0.3 mL IJ SOAJ injection Inject 0.3 mg into the muscle as needed for anaphylaxis. 0.3 mL 1   FIBER PO Take 1 capsule by mouth in the morning, at noon, and at bedtime.     Multiple Vitamin (MULTIVITAMIN) tablet Take 2 tablets by mouth daily. Gummies     Probiotic  Product (PROBIOTIC PO) Take 1 capsule by mouth daily.     levocetirizine (XYZAL) 5 MG tablet Take 1 tablet (5 mg total) by mouth every evening. 30 tablet 5   Olopatadine HCl 0.2 % SOLN Apply 1 drop to eye daily as needed (itchy/watery eyes). 2.5 mL 5   RYALTRIS 665-25 MCG/ACT SUSP Place two sprays per nostril 1-2 times daily as needed for nasal congestion or drainage 29 g 5   No current facility-administered medications for this visit.     Known medication allergies: No Known Allergies   Physical examination: Blood pressure 98/68, pulse 66, temperature 98.6 F (37 C), resp. rate 20, height 5' 6.25" (1.683 m), weight 129 lb 9.6 oz (58.8 kg), SpO2 99 %.  General: Alert, interactive, in no acute distress. HEENT: PERRLA, TMs pearly gray, turbinates non-edematous without discharge, post-pharynx non erythematous. Neck: Supple without lymphadenopathy. Lungs: Clear to auscultation without wheezing, rhonchi or rales. {no increased work of breathing. CV: Normal S1, S2 without murmurs. Abdomen: Nondistended, nontender. Skin: Warm and dry, without lesions or rashes. Extremities:  No clubbing, cyanosis or edema. Neuro:   Grossly intact.  Diagnositics/Labs: None today  Assessment and plan: Allergic rhinitis with conjunctivitis - Continue avoidance measures for grasses, weeds, trees, indoor molds, outdoor molds, dust mites, cat, dog, and horse. - Continue Xyzal (levocetirizine) 26m tablet once daily.   Ryaltris (olopatadine/mometasone) two sprays per nostril 1-2 times daily as needed for nasal congestion or drainage  Pataday (olopatadine) one drop per eye daily as needed for itchy/watery eyes - You can use an extra dose of the antihistamine, if needed, for breakthrough symptoms.  - Consider nasal saline rinses 1-2 times daily to remove allergens from the nasal cavities as well as help with mucous clearance (this is especially helpful to do before the nasal sprays are given) - Consider allergy  shots as a means of long-term control. - Allergy shots "re-train" and "reset" the immune system to ignore environmental allergens and decrease the resulting immune response to those allergens (sneezing, itchy watery eyes, runny nose, nasal congestion, etc).    - Allergy shots improve symptoms in 80-85% of patients.   Adverse food reaction - Continue avoidance of beer/food-containing hops and also recommend avoidance of almond - Have access to self-injectable epinephrine (Epipen or AuviQ) 0.61m at all times - Follow emergency action plan in case of allergic reaction  Contact dermatitis - Appears you have an irritant contact dermatitis to the acidic juices of apples and tomatoes.   - Food allergy testing for apple and tomato were negative - If you decide to consume these foods would recommend wiping off your face and hands immediately after eating and try your best not to get any juices or liquid on the skin  Follow-up in 6-12 months or sooner if needed  I appreciate the opportunity to take part in Michelle Leach's care. Please do not hesitate to contact me with questions.  Sincerely,   SPrudy Feeler MD Allergy/Immunology [facility]

## 2022-05-08 NOTE — Patient Instructions (Signed)
-   Continue avoidance measures for grasses, weeds, trees, indoor molds, outdoor molds, dust mites, cat, dog, and horse. - Continue Xyzal (levocetirizine) 5mg  tablet once daily.   Ryaltris (olopatadine/mometasone) two sprays per nostril 1-2 times daily as needed for nasal congestion or drainage Pataday (olopatadine) one drop per eye daily as needed for itchy/watery eyes - You can use an extra dose of the antihistamine, if needed, for breakthrough symptoms.  - Consider nasal saline rinses 1-2 times daily to remove allergens from the nasal cavities as well as help with mucous clearance (this is especially helpful to do before the nasal sprays are given) - Consider allergy shots as a means of long-term control. - Allergy shots "re-train" and "reset" the immune system to ignore environmental allergens and decrease the resulting immune response to those allergens (sneezing, itchy watery eyes, runny nose, nasal congestion, etc).    - Allergy shots improve symptoms in 80-85% of patients.   - Continue avoidance of beer/food-containing hops and also recommend avoidance of almond - Have access to self-injectable epinephrine (Epipen or AuviQ) 0.3mg  at all times - Follow emergency action plan in case of allergic reaction  - Appears you have an irritant contact dermatitis to the acidic juices of apples and tomatoes.   - Food allergy testing for apple and tomato were negative - If you decide to consume these foods would recommend wiping off your face and hands immediately after eating and try your best not to get any juices or liquid on the skin  Follow-up in 6-12 months or sooner if needed

## 2022-06-27 DIAGNOSIS — H35711 Central serous chorioretinopathy, right eye: Secondary | ICD-10-CM | POA: Diagnosis not present

## 2022-07-25 DIAGNOSIS — H35711 Central serous chorioretinopathy, right eye: Secondary | ICD-10-CM | POA: Diagnosis not present

## 2022-10-21 IMAGING — DX DG FOOT COMPLETE 3+V*R*
3 series · 3 of 3 positions shown · non-contrast
Comparison: None.

CLINICAL DATA: Right foot pain.  Injured great toe.

EXAM:
RIGHT FOOT COMPLETE - 3+ VIEW

[foot ap]
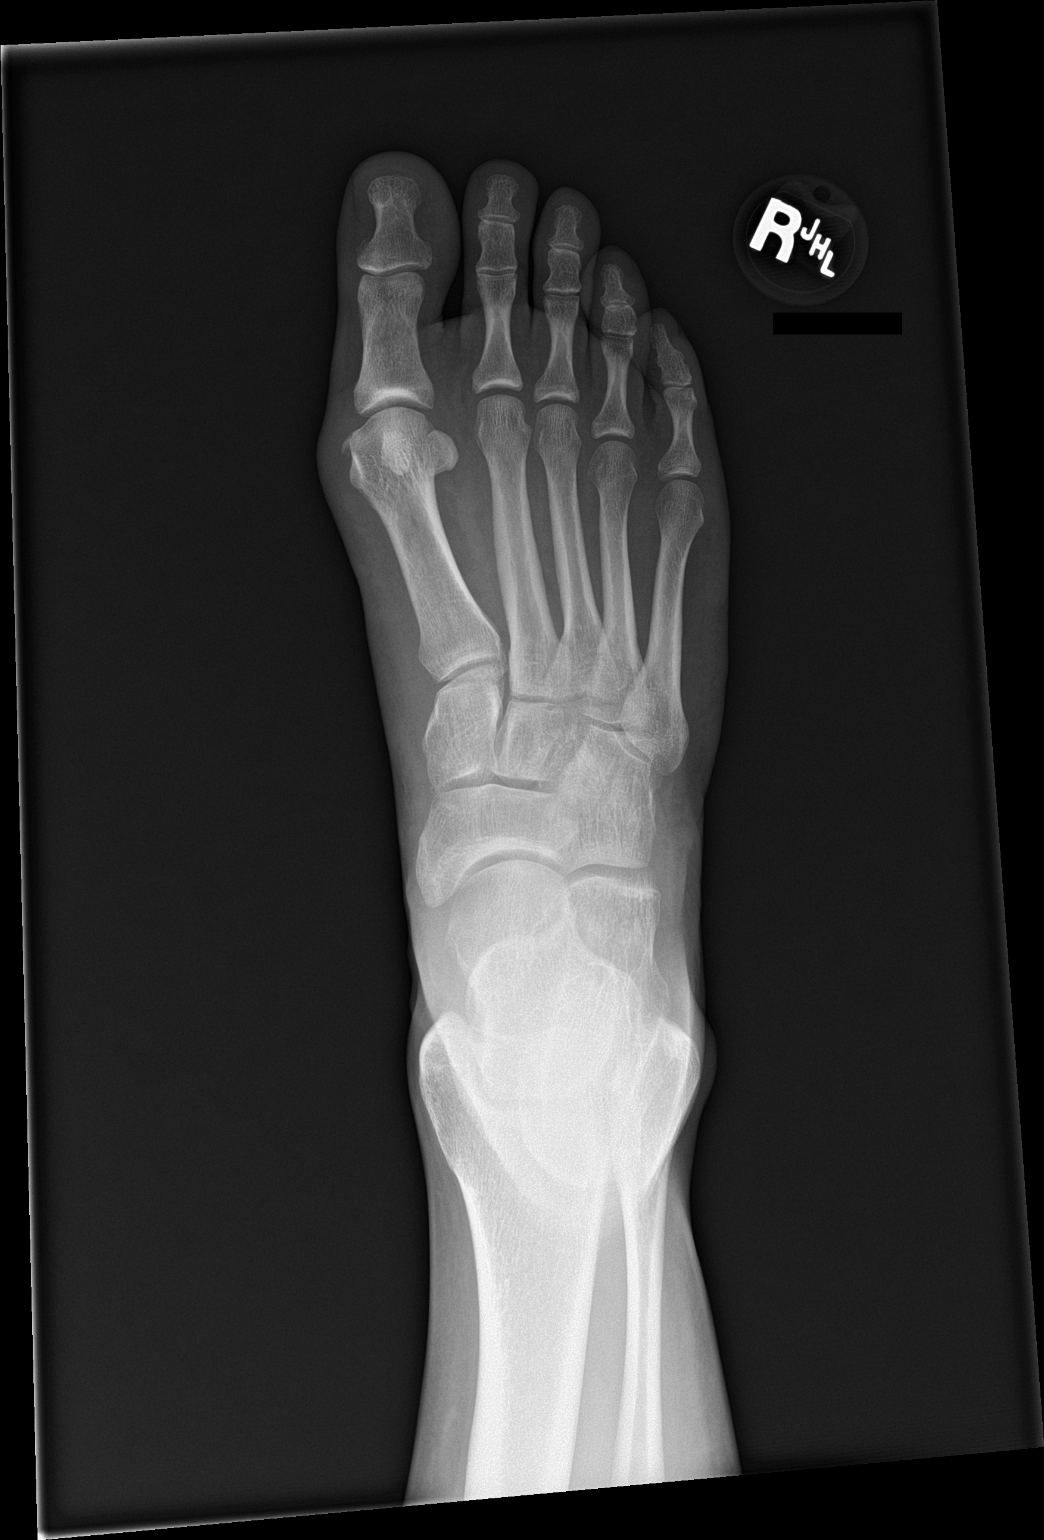

[foot obl]
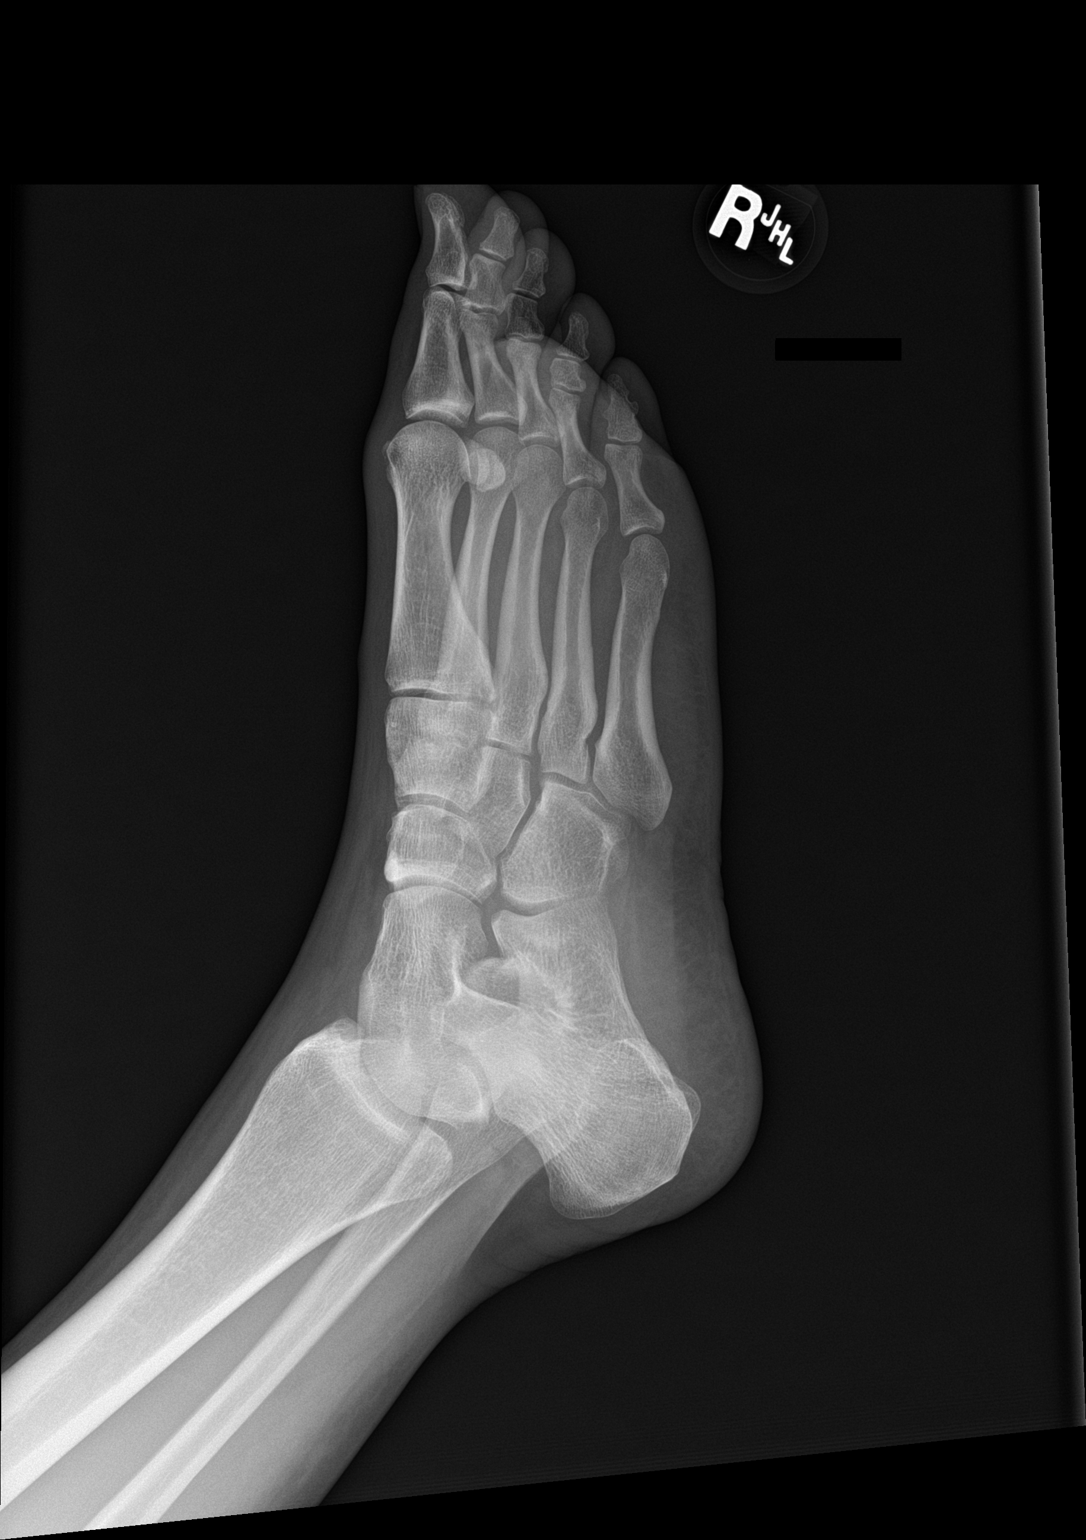

[foot lat]
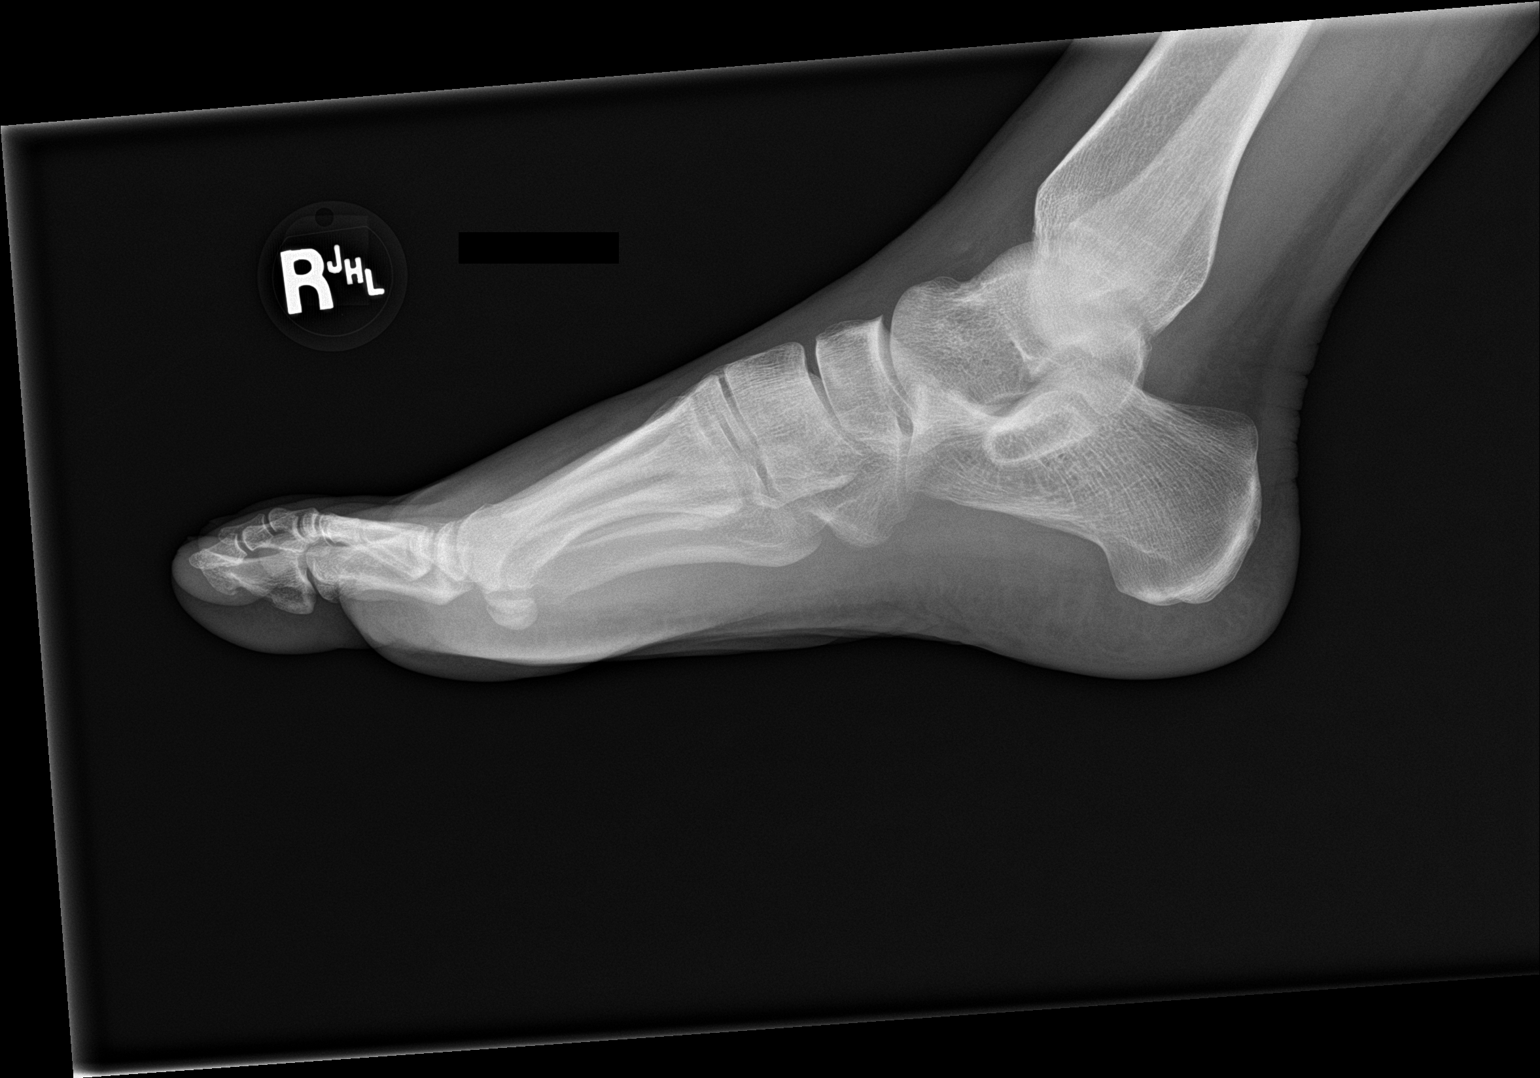

[3 of 3 positions shown; findings below may reference images not displayed]

FINDINGS: There is no evidence of fracture or dislocation. Well corticated
density adjacent to the medial aspect of the first metatarsal
phalangeal joint likely represents an accessory ossicle. There is no
evidence of arthropathy or other focal bone abnormality. Mild medial
soft tissue edema.
IMPRESSION: No fracture or subluxation of the foot. Mild medial soft tissue
edema.

## 2023-01-08 DIAGNOSIS — J029 Acute pharyngitis, unspecified: Secondary | ICD-10-CM | POA: Diagnosis not present

## 2023-10-30 ENCOUNTER — Telehealth: Admitting: Emergency Medicine

## 2023-10-30 DIAGNOSIS — H53411 Scotoma involving central area, right eye: Secondary | ICD-10-CM

## 2023-10-30 DIAGNOSIS — H35711 Central serous chorioretinopathy, right eye: Secondary | ICD-10-CM | POA: Diagnosis not present

## 2023-10-30 NOTE — Progress Notes (Signed)
 Virtual Visit Consent   Michelle Leach, you are scheduled for a virtual visit with a [facility] provider today. Just as with appointments in the office, your consent must be obtained to participate. Your consent will be active for this visit and any virtual visit you may have with one of our providers in the next 365 days. If you have a MyChart account, a copy of this consent can be sent to you electronically.  As this is a virtual visit, video technology does not allow for your provider to perform a traditional examination. This may limit your provider's ability to fully assess your condition. If your provider identifies any concerns that need to be evaluated in person or the need to arrange testing (such as labs, EKG, etc.), we will make arrangements to do so. Although advances in technology are sophisticated, we cannot ensure that it will always work on either your end or our end. If the connection with a video visit is poor, the visit may have to be switched to a telephone visit. With either a video or telephone visit, we are not always able to ensure that we have a secure connection.  By engaging in this virtual visit, you consent to the provision of healthcare and authorize for your insurance to be billed (if applicable) for the services provided during this visit. Depending on your insurance coverage, you may receive a charge related to this service.  I need to obtain your verbal consent now. Are you willing to proceed with your visit today? Michelle Leach has provided verbal consent on 10/30/2023 for a virtual visit (video or telephone). Jon CHRISTELLA Belt, NP  Date: 10/30/2023 10:53 AM   Virtual Visit via Video Note   I, Jon CHRISTELLA Belt, connected with  Michelle Leach  ([id number], 1990-06-07) on 10/30/23 at 10:45 AM EDT by a video-enabled telemedicine application and verified that I am speaking with the correct person using two identifiers.  Location: Patient: Virtual Visit Location Patient:  Other: work Provider: Pharmacist, community: Home Office   I discussed the limitations of evaluation and management by telemedicine and the availability of in person appointments. The patient expressed understanding and agreed to proceed.    History of Present Illness: Michelle Leach is a 33 y.o. who identifies as a female who was assigned female at birth, and is being seen today for Central vision loss in R eye for last several days, getting worse. Denies pain, injury, headache, hx HTN or DM. Reprots left eye is fine.   HPI: HPI  Problems: There are no active problems to display for this patient.   Allergies: No Known Allergies Medications:  Current Outpatient Medications:    EPINEPHrine  (EPIPEN  2-PAK) 0.3 mg/0.3 mL IJ SOAJ injection, Inject 0.3 mg into the muscle as needed for anaphylaxis., Disp: 0.3 mL, Rfl: 1   FIBER PO, Take 1 capsule by mouth in the morning, at noon, and at bedtime., Disp: , Rfl:    levocetirizine (XYZAL ) 5 MG tablet, Take 1 tablet (5 mg total) by mouth every evening., Disp: 30 tablet, Rfl: 5   Multiple Vitamin (MULTIVITAMIN) tablet, Take 2 tablets by mouth daily. Gummies, Disp: , Rfl:    Olopatadine  HCl 0.2 % SOLN, Apply 1 drop to eye daily as needed (itchy/watery eyes)., Disp: 2.5 mL, Rfl: 5   Probiotic Product (PROBIOTIC PO), Take 1 capsule by mouth daily., Disp: , Rfl:    RYALTRIS  665-25 MCG/ACT SUSP, Place two sprays per nostril 1-2 times daily  as needed for nasal congestion or drainage, Disp: 29 g, Rfl: 5  Observations/Objective: Patient is well-developed, well-nourished in no acute distress.  Resting comfortably  at work  Head is normocephalic, atraumatic.  No labored breathing.  Speech is clear and coherent with logical content.  Patient is alert and oriented at baseline.    Assessment and Plan: 1. Vision loss, central, right (Primary)  Needs prompt atten from ophth. Given contact for oph on call.   Follow Up Instructions: I discussed the  assessment and treatment plan with the patient. The patient was provided an opportunity to ask questions and all were answered. The patient agreed with the plan and demonstrated an understanding of the instructions.  A copy of instructions were sent to the patient via MyChart unless otherwise noted below.   The patient was advised to call back or seek an in-person evaluation if the symptoms worsen or if the condition fails to improve as anticipated.    Jon CHRISTELLA Belt, NP

## 2023-10-30 NOTE — Patient Instructions (Addendum)
  Kaymarie A Heffner, thank you for joining Jon CHRISTELLA Belt, NP for today's virtual visit.  While this provider is not your primary care provider (PCP), if your PCP is located in our provider database this encounter information will be shared with them immediately following your visit.   A [facility] MyChart account gives you access to today's visit and all your visits, tests, and labs performed at [facility]  click here if you don't have a [facility] MyChart account or go to [email or web address]  Consent: (Patient) Kendria A Canavan provided verbal consent for this virtual visit at the beginning of the encounter.  Current Medications:  Current Outpatient Medications:    EPINEPHrine  (EPIPEN  2-PAK) 0.3 mg/0.3 mL IJ SOAJ injection, Inject 0.3 mg into the muscle as needed for anaphylaxis., Disp: 0.3 mL, Rfl: 1   FIBER PO, Take 1 capsule by mouth in the morning, at noon, and at bedtime., Disp: , Rfl:    levocetirizine (XYZAL ) 5 MG tablet, Take 1 tablet (5 mg total) by mouth every evening., Disp: 30 tablet, Rfl: 5   Multiple Vitamin (MULTIVITAMIN) tablet, Take 2 tablets by mouth daily. Gummies, Disp: , Rfl:    Olopatadine  HCl 0.2 % SOLN, Apply 1 drop to eye daily as needed (itchy/watery eyes)., Disp: 2.5 mL, Rfl: 5   Probiotic Product (PROBIOTIC PO), Take 1 capsule by mouth daily., Disp: , Rfl:    RYALTRIS  665-25 MCG/ACT SUSP, Place two sprays per nostril 1-2 times daily as needed for nasal congestion or drainage, Disp: 29 g, Rfl: 5   Medications ordered in this encounter:  No orders of the defined types were placed in this encounter.    *If you need refills on other medications prior to your next appointment, please contact your pharmacy*  Follow-Up: Call back or seek an in-person evaluation if the symptoms worsen or if the condition fails to improve as anticipated.  [facility] [phone]  Other Instructions   You are having central vision loss in your  right eye. Please call the ophthalmologist on call, Dr. Wanda Mae, now and get prompt evaluation.   [facility] Ophthalmology Dr. Wanda Mae [phone] [address] [place]   If you have been instructed to have an in-person evaluation today at a local Urgent Care facility, please use the link below. It will take you to a list of all of our available [facility], including address, phone number and hours of operation. Please do not delay care.  [facility]  If you or a family member do not have a primary care provider, use the link below to schedule a visit and establish care. When you choose a [facility] primary care physician or advanced practice provider, you gain a long-term partner in health. Find a Primary Care Provider  Learn more about [facility] in-office and virtual care options:  - Get Care Now

## 2023-12-02 DIAGNOSIS — H35711 Central serous chorioretinopathy, right eye: Secondary | ICD-10-CM | POA: Diagnosis not present

## 2024-02-05 ENCOUNTER — Ambulatory Visit: Admitting: Nurse Practitioner

## 2024-02-05 ENCOUNTER — Encounter: Payer: Self-pay | Admitting: Nurse Practitioner

## 2024-02-05 VITALS — BP 110/60 | HR 70 | Temp 98.0°F | Ht 66.0 in | Wt 137.4 lb

## 2024-02-05 DIAGNOSIS — Z2821 Immunization not carried out because of patient refusal: Secondary | ICD-10-CM

## 2024-02-05 DIAGNOSIS — R519 Headache, unspecified: Secondary | ICD-10-CM | POA: Diagnosis not present

## 2024-02-05 DIAGNOSIS — H538 Other visual disturbances: Secondary | ICD-10-CM

## 2024-02-05 DIAGNOSIS — Z7689 Persons encountering health services in other specified circumstances: Secondary | ICD-10-CM | POA: Diagnosis not present

## 2024-02-05 NOTE — Progress Notes (Signed)
 LILLETTE Kristeen JINNY Gladis, CMA,acting as a neurosurgeon for Michelle Ada, FNP.,have documented all relevant documentation on the behalf of Michelle Ada, FNP,as directed by  Michelle Ada, FNP while in the presence of Michelle Ada, FNP.  Subjective:  Patient ID: Michelle Leach , female    DOB: 1991-02-01 , 33 y.o.   MRN: [id number]  Chief Complaint  Patient presents with   Establish Care    Patient presents today to establish care, Patient reports compliance with medication. Patient denies any chest pain, SOB.   Headache    Patient reports she had one headache that lasted a day a half. Patient reports this happened last Friday and Saturday. She reports this is the first time it has happened.    Blurred Vision    Patient reports she has had on and off again episodes of blurry vision for the past year. She reports a eye doctor told her it was fluid under her retina, she was told it was related to stress. She reports it happens in her right eye only and it last for about a month then goes away.     HPI Discussed the use of AI scribe software for clinical note transcription with the patient, who gave verbal consent to proceed.  History of Present Illness Michelle Leach is a 33 year old female who presents with a new onset migraine and blurred vision.  She experienced her first migraine last week, which was severe and required her to stay in a dark room for about a day and a half. Excedrin eventually alleviated the headache. Associated symptoms included nausea, photophobia, osmophobia, and fatigue. Since the migraine, she has had occasional shooting pains in the occipital region lasting about ten seconds, occurring a couple of times.  She reports blurred vision and was seen by an optometrist for evaluation. She has a history of diverticulitis with two flare-ups, one requiring hospitalization. Dietary modifications, such as reducing meat and alcohol intake, have helped manage her diverticulitis  symptoms.  Her past medical history includes diverticulitis, small liver cysts identified in 2022, and seasonal allergies. She currently takes a probiotic. Her family history is notable for her father having diverticulitis and her maternal grandmother having breast cancer. She is engaged and works in set designer.  She has a history of diverticulitis - 2 flares. She is not established with a GI provider. She works in set designer. Engaged. No children.   Headache  This is a new problem. The current episode started in the past 7 days. Associated symptoms include nausea, photophobia and weakness. Pertinent negatives include no sore throat. Associated symptoms comments: Fatigue, smell sensitvity. The symptoms are aggravated by fatigue and bright light. She has tried acetaminophen for the symptoms. There is no history of hypertension or obesity.     Past Medical History:  Diagnosis Date   Allergy     Diverticulitis    Diverticulosis    Liver cyst      Family History  Problem Relation Age of Onset   Diverticulitis Father    Breast cancer Maternal Grandmother    Colon cancer Neg Hx    Esophageal cancer Neg Hx    Rectal cancer Neg Hx    Stomach cancer Neg Hx      Current Outpatient Medications:    Probiotic Product (PROBIOTIC PO), Take 1 capsule by mouth daily., Disp: , Rfl:    No Known Allergies   Review of Systems  Constitutional:  Positive for fatigue.  HENT:  Negative for sore throat.  Eyes:  Positive for photophobia.  Respiratory: Negative.    Cardiovascular: Negative.   Gastrointestinal:  Positive for nausea.  Neurological:  Positive for weakness and headaches.  Psychiatric/Behavioral: Negative.       Today's Vitals   02/05/24 1436  BP: 110/60  Pulse: 70  Temp: 98 F (36.7 C)  TempSrc: Oral  Weight: 137 lb 6.4 oz (62.3 kg)  Height: 5' 6 (1.676 m)  PainSc: 0-No pain   Body mass index is 22.18 kg/m.  Wt Readings from Last 3 Encounters:  02/05/24 137 lb 6.4  oz (62.3 kg)  05/08/22 129 lb 9.6 oz (58.8 kg)  07/26/21 136 lb (61.7 kg)     Objective:  Physical Exam Vitals and nursing note reviewed.  Constitutional:      General: She is not in acute distress.    Appearance: She is well-developed.  Cardiovascular:     Heart sounds: Normal heart sounds. No murmur heard. Pulmonary:     Effort: Pulmonary effort is normal. No respiratory distress.     Breath sounds: Normal breath sounds. No wheezing.  Neurological:     Mental Status: She is alert and oriented to person, place, and time.        Assessment And Plan:   Assessment & Plan Establishing care with new doctor, encounter for Patient is here to establish care. Went over patient medical, family, social and surgical history. Reviewed with patient their medications and any allergies  Reviewed with patient their sexual orientation, drug/tobacco and alcohol use Dicussed any new concerns with patient  recommended patient comes in for a physical exam and complete blood work.  Educated patient about the importance of annual screenings and immunizations.  Advised patient to eat a healthy diet along with exercise for atleast 30-45 min at least 4-5 days of the week.   Acute nonintractable headache, unspecified headache type First migraine with visual disturbance, nausea, photophobia, phonophobia, and fatigue. Resolved with Excedrin and rest. Stress identified as a potential trigger. - Ordered CBC, TSH, CMP. - Advised headache log for frequency, severity, triggers. - Discussed potential need for migraine-specific medication if frequent or severe. Recurrent sharp head pain, possible neuralgia, potentially trigeminal neuralgia. No current medication needed unless pain becomes persistent and affects daily life. - Monitor frequency and severity of sharp head pain. - Consider gabapentin or amitriptyline if pain becomes persistent and affects daily life. Blurry vision, right eye She has seen an  optometrist, may be related to her headaches.  Influenza vaccination declined   Orders Placed This Encounter  Procedures   TSH   CMP14+EGFR   CBC with Differential/Platelet   Vitamin B12    Return in about 4 months (around 06/04/2024) for phy when able.  Patient was given opportunity to ask questions. Patient verbalized understanding of the plan and was able to repeat key elements of the plan. All questions were answered to their satisfaction.     LILLETTE Michelle Ada, FNP, have reviewed all documentation for this visit. The documentation on 02/05/24 for the exam, diagnosis, procedures, and orders are all accurate and complete.  IF YOU HAVE BEEN REFERRED TO A SPECIALIST, IT MAY TAKE 1-2 WEEKS TO SCHEDULE/PROCESS THE REFERRAL. IF YOU HAVE NOT HEARD FROM US /SPECIALIST IN TWO WEEKS, PLEASE GIVE US  A CALL AT [phone].

## 2024-02-06 LAB — CMP14+EGFR
ALT: 36 IU/L — ABNORMAL HIGH (ref 0–32)
AST: 33 IU/L (ref 0–40)
Albumin: 4.8 g/dL (ref 3.9–4.9)
Alkaline Phosphatase: 65 IU/L (ref 41–116)
BUN/Creatinine Ratio: 14 (ref 9–23)
BUN: 9 mg/dL (ref 6–20)
Bilirubin Total: 0.5 mg/dL (ref 0.0–1.2)
CO2: 23 mmol/L (ref 20–29)
Calcium: 9.9 mg/dL (ref 8.7–10.2)
Chloride: 101 mmol/L (ref 96–106)
Creatinine, Ser: 0.65 mg/dL (ref 0.57–1.00)
Globulin, Total: 3 g/dL (ref 1.5–4.5)
Glucose: 73 mg/dL (ref 70–99)
Potassium: 4.7 mmol/L (ref 3.5–5.2)
Sodium: 138 mmol/L (ref 134–144)
Total Protein: 7.8 g/dL (ref 6.0–8.5)
eGFR: 118 mL/min/1.73 (ref >59)

## 2024-02-06 LAB — CBC WITH DIFFERENTIAL/PLATELET
Basophils Absolute: 0.1 x10E3/uL (ref 0.0–0.2)
Basos: 1 %
EOS (ABSOLUTE): 0.1 x10E3/uL (ref 0.0–0.4)
Eos: 1 %
Hematocrit: 41.8 % (ref 34.0–46.6)
Hemoglobin: 14 g/dL (ref 11.1–15.9)
Immature Grans (Abs): 0 x10E3/uL (ref 0.0–0.1)
Immature Granulocytes: 0 %
Lymphocytes Absolute: 3.2 x10E3/uL — ABNORMAL HIGH (ref 0.7–3.1)
Lymphs: 37 %
MCH: 31.3 pg (ref 26.6–33.0)
MCHC: 33.5 g/dL (ref 31.5–35.7)
MCV: 94 fL (ref 79–97)
Monocytes Absolute: 0.6 x10E3/uL (ref 0.1–0.9)
Monocytes: 7 %
Neutrophils Absolute: 4.5 x10E3/uL (ref 1.4–7.0)
Neutrophils: 54 %
Platelets: 426 x10E3/uL (ref 150–450)
RBC: 4.47 x10E6/uL (ref 3.77–5.28)
RDW: 12.2 % (ref 11.7–15.4)
WBC: 8.5 x10E3/uL (ref 3.4–10.8)

## 2024-02-06 LAB — VITAMIN B12: Vitamin B-12: 666 pg/mL (ref 232–1245)

## 2024-02-06 LAB — TSH: TSH: 7.59 u[IU]/mL — ABNORMAL HIGH (ref 0.450–4.500)

## 2024-02-15 ENCOUNTER — Ambulatory Visit: Payer: Self-pay | Admitting: Nurse Practitioner

## 2024-02-16 ENCOUNTER — Other Ambulatory Visit: Payer: Self-pay | Admitting: Nurse Practitioner

## 2024-02-16 DIAGNOSIS — E039 Hypothyroidism, unspecified: Secondary | ICD-10-CM

## 2024-02-16 MED ORDER — LEVOTHYROXINE SODIUM 25 MCG PO TABS: 25.0000 ug | ORAL_TABLET | Freq: Every day | ORAL | 2 refills | Status: DC

## 2024-02-24 ENCOUNTER — Other Ambulatory Visit: Payer: Self-pay

## 2024-02-24 DIAGNOSIS — E039 Hypothyroidism, unspecified: Secondary | ICD-10-CM

## 2024-02-24 NOTE — Telephone Encounter (Signed)
 Please schedule f/u thyroid in 6 weeks with repeat TSH

## 2024-03-22 ENCOUNTER — Other Ambulatory Visit

## 2024-03-22 DIAGNOSIS — E039 Hypothyroidism, unspecified: Secondary | ICD-10-CM

## 2024-03-22 LAB — TSH: TSH: 6.82 u[IU]/mL — ABNORMAL HIGH (ref 0.450–4.500)

## 2024-03-24 ENCOUNTER — Ambulatory Visit: Payer: Self-pay | Admitting: Nurse Practitioner

## 2024-03-24 DIAGNOSIS — E039 Hypothyroidism, unspecified: Secondary | ICD-10-CM

## 2024-03-24 MED ORDER — LEVOTHYROXINE SODIUM 50 MCG PO TABS: 50.0000 ug | ORAL_TABLET | Freq: Every day | ORAL | 1 refills | Status: AC

## 2024-04-06 ENCOUNTER — Other Ambulatory Visit

## 2024-06-08 ENCOUNTER — Encounter: Admitting: Nurse Practitioner
